# Patient Record
Sex: Male | Born: 1965 | Race: White | Hispanic: No | Marital: Single | Smoking: Never smoker
Health system: Southern US, Community
[De-identification: ages and names within clinical notes are randomized; demographics above are authoritative.]

## PROBLEM LIST (undated history)

## (undated) DIAGNOSIS — E7800 Pure hypercholesterolemia, unspecified: Secondary | ICD-10-CM

## (undated) DIAGNOSIS — N200 Calculus of kidney: Secondary | ICD-10-CM

## (undated) DIAGNOSIS — E119 Type 2 diabetes mellitus without complications: Secondary | ICD-10-CM

## (undated) DIAGNOSIS — I10 Essential (primary) hypertension: Secondary | ICD-10-CM

## (undated) DIAGNOSIS — K449 Diaphragmatic hernia without obstruction or gangrene: Secondary | ICD-10-CM

## (undated) DIAGNOSIS — IMO0002 Reserved for concepts with insufficient information to code with codable children: Secondary | ICD-10-CM

## (undated) DIAGNOSIS — Z87442 Personal history of urinary calculi: Secondary | ICD-10-CM

## (undated) DIAGNOSIS — R251 Tremor, unspecified: Secondary | ICD-10-CM

## (undated) HISTORY — PX: LITHOTRIPSY: SUR834

## (undated) HISTORY — PX: KNEE SURGERY: SHX244

## (undated) HISTORY — PX: TONSILLECTOMY: SUR1361

## (undated) HISTORY — PX: APPENDECTOMY (OPEN): SHX54

## (undated) HISTORY — PX: TONSILLECTOMY: SHX5618

## (undated) HISTORY — PX: TONSILLECTOMY AND ADENOIDECTOMY: SUR1326

## (undated) HISTORY — DX: Reserved for concepts with insufficient information to code with codable children: IMO0002

## (undated) HISTORY — PX: COLONOSCOPY: SHX174

## (undated) HISTORY — DX: Personal history of urinary calculi: Z87.442

## (undated) HISTORY — DX: Diaphragmatic hernia without obstruction or gangrene: K44.9

## (undated) HISTORY — DX: Tremor, unspecified: R25.1

---

## 2005-02-20 ENCOUNTER — Encounter: Admission: RE | Admit: 2005-02-20 | Discharge: 2005-02-20 | Payer: Self-pay | Admitting: Orthopedic Surgery

## 2005-12-02 ENCOUNTER — Emergency Department: Admit: 2005-12-02 | Payer: Self-pay | Source: Emergency Department | Admitting: Emergency Medicine

## 2005-12-02 LAB — URINALYSIS WITH MICROSCOPIC
Bilirubin, UA: NEGATIVE
Glucose, UA: NEGATIVE
Ketones UA: NEGATIVE
Leukocyte Esterase, UA: NEGATIVE
Nitrite, UA: NEGATIVE
Protein, UR: NEGATIVE
Specific Gravity UA POCT: 1.018 (ref 1.005–1.030)
Urine pH: 7 (ref 4.6–8.0)
Urobilinogen, UA: 0.2 EU/dL (ref 0.2–1.0)
WBC, UA: 2 /HPF (ref 0–5)

## 2005-12-02 LAB — BASIC METABOLIC PANEL - AH CERNER
Anion Gap: 15 mEq/L (ref 5–15)
BUN: 9 mg/dL (ref 6–20)
CO2: 20.9 mEq/L — ABNORMAL LOW (ref 22.0–29.0)
Calcium: 9.3 mg/dL (ref 8.4–10.2)
Chloride: 103 mEq/L (ref 96–108)
Creatinine: 0.9 mg/dL (ref 0.5–1.2)
Glucose: 102 mg/dL
Osmolality Calculated: 287 mosm/kg (ref 282–298)
Potassium: 3.9 mEq/L (ref 3.3–5.1)
Sodium: 139 mEq/L (ref 135–145)
UN/CREA SOFT: 10 RATIO (ref 6–33)

## 2005-12-02 LAB — CBC WITH AUTO DIFFERENTIAL CERNER
Basophils Absolute: 0 /mm3 (ref 0.0–0.2)
Basophils: 1 % (ref 0–2)
Eosinophils Absolute: 0.1 /mm3 (ref 0.0–0.2)
Eosinophils: 2 % (ref 0–5)
Granulocytes Absolute: 4.3 /mm3 (ref 1.8–8.1)
Hematocrit: 41.5 % — ABNORMAL LOW (ref 42.0–52.0)
Hgb: 14 G/DL (ref 13.0–17.0)
Lymphocytes Absolute: 2.3 /mm3 (ref 0.5–4.4)
Lymphocytes: 31 % (ref 15–41)
MCH: 29.1 PG (ref 28.0–32.0)
MCHC: 33.7 G/DL (ref 32.0–36.0)
MCV: 86.3 FL (ref 80.0–100.0)
MPV: 8.4 FL (ref 7.4–10.4)
Monocytes Absolute: 0.6 /mm3 (ref 0.0–1.2)
Monocytes: 9 % (ref 0–11)
Neutrophils %: 59 % (ref 52–75)
Platelets: 329 /mm3 (ref 140–400)
RBC: 4.81 /mm3 (ref 4.70–6.00)
RDW: 13.3 % (ref 11.5–15.0)
WBC: 7.4 /mm3 (ref 3.5–10.8)

## 2005-12-02 LAB — GFR

## 2005-12-02 LAB — HEMOLYSIS INDEX: Hemolysis Index: 7 Units

## 2005-12-07 ENCOUNTER — Emergency Department: Admit: 2005-12-07 | Payer: Self-pay | Source: Emergency Department | Admitting: Emergency Medicine

## 2005-12-07 LAB — URINALYSIS WITH MICROSCOPIC
Bilirubin, UA: NEGATIVE
Glucose, UA: NEGATIVE
Ketones UA: NEGATIVE
Nitrite, UA: NEGATIVE
Protein, UR: 100 — AB
RBC, UA: 3441 /HPF — ABNORMAL HIGH (ref 0–3)
Specific Gravity UA POCT: 1.019 (ref 1.005–1.030)
Urine pH: 5.5 (ref 4.6–8.0)
Urobilinogen, UA: 1 EU/dL (ref 0.2–1.0)
WBC, UA: 10 /HPF — ABNORMAL HIGH (ref 0–5)

## 2005-12-07 LAB — COMPREHENSIVE METABOLIC PANEL - AH CERNER
ALT: 53 U/L — ABNORMAL HIGH (ref 0–41)
AST (SGOT): 37 U/L (ref 0–37)
Albumin/Globulin Ratio: 1.9 (ref 1.1–2.2)
Albumin: 4.5 g/dL (ref 3.4–4.8)
Alkaline Phosphatase: 124 U/L (ref 40–129)
Anion Gap: 14 mEq/L (ref 5–15)
BUN: 13 mg/dL (ref 6–20)
Bilirubin, Total: 0.7 mg/dL (ref 0.0–1.0)
CA: 4.2 mEq/L (ref 3.8–4.6)
CO2: 19.8 mEq/L — ABNORMAL LOW (ref 22.0–29.0)
Calcium: 9.4 mg/dL (ref 8.4–10.2)
Chloride: 103 mEq/L (ref 96–108)
Creatinine: 0.8 mg/dL (ref 0.5–1.2)
Globulin: 2.4 g/dL (ref 2.0–3.6)
Glucose: 152 mg/dL
Osmolality Calculated: 287 mosm/kg (ref 282–298)
Potassium: 5.3 mEq/L — ABNORMAL HIGH (ref 3.3–5.1)
Protein, Total: 6.9 g/dL (ref 6.4–8.3)
Sodium: 137 mEq/L (ref 135–145)
UN/CREA SOFT: 16 RATIO (ref 6–33)

## 2005-12-07 LAB — CBC WITH AUTO DIFFERENTIAL CERNER
Basophils Absolute: 0 /mm3 (ref 0.0–0.2)
Basophils: 0 % (ref 0–2)
Eosinophils Absolute: 0.1 /mm3 (ref 0.0–0.2)
Eosinophils: 2 % (ref 0–5)
Granulocytes Absolute: 4.3 /mm3 (ref 1.8–8.1)
Hematocrit: 44.5 % (ref 42.0–52.0)
Hgb: 15.8 G/DL (ref 13.0–17.0)
Lymphocytes Absolute: 1.6 /mm3 (ref 0.5–4.4)
Lymphocytes: 25 % (ref 15–41)
MCH: 29.7 PG (ref 28.0–32.0)
MCHC: 35.4 G/DL (ref 32.0–36.0)
MCV: 83.8 FL (ref 80.0–100.0)
MPV: 7.9 FL (ref 7.4–10.4)
Monocytes Absolute: 0.4 /mm3 (ref 0.0–1.2)
Monocytes: 7 % (ref 0–11)
Neutrophils %: 66 % (ref 52–75)
Platelets: 399 /mm3 (ref 140–400)
RBC: 5.3 /mm3 (ref 4.70–6.00)
RDW: 13.4 % (ref 11.5–15.0)
WBC: 6.5 /mm3 (ref 3.5–10.8)

## 2005-12-07 LAB — HEMOLYSIS INDEX: Hemolysis Index: 232 Units

## 2005-12-07 LAB — GFR

## 2006-01-08 ENCOUNTER — Emergency Department: Admit: 2006-01-08 | Payer: Self-pay | Source: Emergency Department | Admitting: Emergency Medicine

## 2006-01-08 LAB — CBC WITH AUTO DIFFERENTIAL CERNER
Basophils Absolute: 0 /mm3 (ref 0.0–0.2)
Basophils: 1 % (ref 0–2)
Eosinophils Absolute: 0.2 /mm3 (ref 0.0–0.2)
Eosinophils: 4 % (ref 0–5)
Granulocytes Absolute: 2.5 /mm3 (ref 1.8–8.1)
Hematocrit: 46.3 % (ref 42.0–52.0)
Hgb: 16.2 G/DL (ref 13.0–17.0)
Lymphocytes Absolute: 1.2 /mm3 (ref 0.5–4.4)
Lymphocytes: 29 % (ref 15–41)
MCH: 30 PG (ref 28.0–32.0)
MCHC: 35 G/DL (ref 32.0–36.0)
MCV: 85.7 FL (ref 80.0–100.0)
MPV: 7.9 FL (ref 7.4–10.4)
Monocytes Absolute: 0.4 /mm3 (ref 0.0–1.2)
Monocytes: 9 % (ref 0–11)
Neutrophils %: 59 % (ref 52–75)
Platelets: 307 /mm3 (ref 140–400)
RBC: 5.4 /mm3 (ref 4.70–6.00)
RDW: 12.8 % (ref 11.5–15.0)
WBC: 4.3 /mm3 (ref 3.5–10.8)

## 2006-01-08 LAB — URINALYSIS WITH MICROSCOPIC
Bilirubin, UA: NEGATIVE
Blood, UA: NEGATIVE
Glucose, UA: NEGATIVE
Ketones UA: NEGATIVE
Leukocyte Esterase, UA: NEGATIVE
Nitrite, UA: NEGATIVE
Protein, UR: NEGATIVE
RBC, UA: 3 /HPF (ref 0–3)
Specific Gravity UA POCT: 1.013 (ref 1.005–1.030)
Urine pH: 8.5 — ABNORMAL HIGH (ref 4.6–8.0)
Urobilinogen, UA: 0.2 EU/dL (ref 0.2–1.0)
WBC, UA: 1 /HPF (ref 0–5)

## 2006-01-08 LAB — COMPREHENSIVE METABOLIC PANEL - AH CERNER
ALT: 54 U/L — ABNORMAL HIGH (ref 0–41)
AST (SGOT): 36 U/L (ref 0–37)
Albumin/Globulin Ratio: 2.4 — ABNORMAL HIGH (ref 1.1–2.2)
Albumin: 5 g/dL — ABNORMAL HIGH (ref 3.4–4.8)
Alkaline Phosphatase: 121 U/L (ref 40–129)
Anion Gap: 10 mEq/L (ref 5–15)
BUN: 11 mg/dL (ref 6–20)
Bilirubin, Total: 1 mg/dL (ref 0.0–1.0)
CA: 4.2 mEq/L (ref 3.8–4.6)
CO2: 28 mEq/L (ref 22.0–29.0)
Calcium: 9.6 mg/dL (ref 8.4–10.2)
Chloride: 101 mEq/L (ref 96–108)
Creatinine: 0.8 mg/dL (ref 0.5–1.2)
Globulin: 2.1 g/dL (ref 2.0–3.6)
Glucose: 127 mg/dL
Osmolality Calculated: 289 mosm/kg (ref 282–298)
Potassium: 4.4 mEq/L (ref 3.3–5.1)
Protein, Total: 7.1 g/dL (ref 6.4–8.3)
Sodium: 139 mEq/L (ref 135–145)
UN/CREA SOFT: 14 RATIO (ref 6–33)

## 2006-01-08 LAB — HEMOLYSIS INDEX: Hemolysis Index: 14 Units

## 2006-01-08 LAB — GFR

## 2006-09-21 ENCOUNTER — Emergency Department: Admit: 2006-09-21 | Payer: Self-pay | Source: Emergency Department | Admitting: Emergency Medicine

## 2006-09-21 LAB — CBC WITH AUTO DIFFERENTIAL CERNER
Basophils Absolute: 0 /mm3 (ref 0.0–0.2)
Basophils: 1 % (ref 0–2)
Eosinophils Absolute: 0.1 /mm3 (ref 0.0–0.2)
Eosinophils: 2 % (ref 0–5)
Granulocytes Absolute: 2.8 /mm3 (ref 1.8–8.1)
Hematocrit: 42.6 % (ref 42.0–52.0)
Hgb: 14.7 G/DL (ref 13.0–17.0)
Lymphocytes Absolute: 1.8 /mm3 (ref 0.5–4.4)
Lymphocytes: 35 % (ref 15–41)
MCH: 28.8 PG (ref 28.0–32.0)
MCHC: 34.5 G/DL (ref 32.0–36.0)
MCV: 83.5 FL (ref 80.0–100.0)
MPV: 8.3 FL (ref 7.4–10.4)
Monocytes Absolute: 0.4 /mm3 (ref 0.0–1.2)
Monocytes: 9 % (ref 0–11)
Neutrophils %: 54 % (ref 52–75)
Platelets: 280 /mm3 (ref 140–400)
RBC: 5.1 /mm3 (ref 4.70–6.00)
RDW: 12.6 % (ref 11.5–15.0)
WBC: 5.2 /mm3 (ref 3.5–10.8)

## 2006-09-21 LAB — GFR

## 2006-09-21 LAB — COMPREHENSIVE METABOLIC PANEL - AH CERNER
ALT: 29 U/L (ref 0–41)
AST (SGOT): 17 U/L (ref 0–37)
Albumin/Globulin Ratio: 2.6 — ABNORMAL HIGH (ref 1.1–2.2)
Albumin: 4.5 g/dL (ref 3.4–4.8)
Alkaline Phosphatase: 113 U/L (ref 40–129)
Anion Gap: 9 mEq/L (ref 5–15)
BUN: 11 mg/dL (ref 6–20)
Bilirubin, Total: 0.5 mg/dL (ref 0.0–1.0)
CA: 4.4 mEq/L (ref 3.8–4.6)
CO2: 26.8 mEq/L (ref 22.0–29.0)
Calcium: 9.2 mg/dL (ref 8.4–10.2)
Chloride: 103 mEq/L (ref 96–108)
Creatinine: 0.8 mg/dL (ref 0.5–1.2)
Globulin: 1.7 g/dL — ABNORMAL LOW (ref 2.0–3.6)
Glucose: 157 mg/dL
Osmolality Calculated: 291 mosm/kg (ref 282–298)
Potassium: 4.4 mEq/L (ref 3.3–5.1)
Protein, Total: 6.2 g/dL — ABNORMAL LOW (ref 6.4–8.3)
Sodium: 139 mEq/L (ref 133–145)
UN/CREA SOFT: 14 RATIO (ref 6–33)

## 2006-09-21 LAB — URINALYSIS WITH MICROSCOPIC
Bilirubin, UA: NEGATIVE
Ketones UA: NEGATIVE
Leukocyte Esterase, UA: NEGATIVE
Nitrite, UA: NEGATIVE
Protein, UR: NEGATIVE
RBC, UA: 36 /HPF — ABNORMAL HIGH (ref 0–3)
Specific Gravity UA POCT: 1.026 (ref 1.005–1.030)
Urine pH: 5 (ref 4.6–8.0)
Urobilinogen, UA: 0.2 EU/dL (ref 0.2–1.0)

## 2006-09-21 LAB — HEMOLYSIS INDEX: Hemolysis Index: 40 Units

## 2006-10-04 ENCOUNTER — Emergency Department: Admit: 2006-10-04 | Payer: Self-pay | Source: Emergency Department | Admitting: Emergency Medicine

## 2006-10-04 LAB — URINALYSIS WITH MICROSCOPIC
Bilirubin, UA: NEGATIVE
Glucose, UA: NEGATIVE
Ketones UA: NEGATIVE
Leukocyte Esterase, UA: NEGATIVE
Nitrite, UA: NEGATIVE
Protein, UR: NEGATIVE
RBC, UA: 26 /HPF — ABNORMAL HIGH (ref 0–3)
Specific Gravity UA POCT: 1.026 (ref 1.005–1.030)
Urine pH: 5 (ref 4.6–8.0)
Urobilinogen, UA: 0.2 EU/dL (ref 0.2–1.0)

## 2006-10-04 LAB — COMPREHENSIVE METABOLIC PANEL - AH CERNER
ALT: 47 U/L — ABNORMAL HIGH (ref 0–41)
AST (SGOT): 25 U/L (ref 0–37)
Albumin/Globulin Ratio: 2.1 (ref 1.1–2.2)
Albumin: 4.8 g/dL (ref 3.4–4.8)
Alkaline Phosphatase: 134 U/L — ABNORMAL HIGH (ref 40–129)
Anion Gap: 12 mEq/L (ref 5–15)
BUN: 13 mg/dL (ref 6–20)
Bilirubin, Total: 0.5 mg/dL (ref 0.0–1.0)
CA: 4.7 mEq/L — ABNORMAL HIGH (ref 3.8–4.6)
CO2: 26.6 mEq/L (ref 22.0–29.0)
Calcium: 10.6 mg/dL — ABNORMAL HIGH (ref 8.4–10.2)
Chloride: 101 mEq/L (ref 96–108)
Creatinine: 0.8 mg/dL (ref 0.5–1.2)
Globulin: 2.3 g/dL (ref 2.0–3.6)
Glucose: 165 mg/dL
Osmolality Calculated: 294 mosm/kg (ref 282–298)
Potassium: 4.3 mEq/L (ref 3.3–5.1)
Protein, Total: 7.1 g/dL (ref 6.4–8.3)
Sodium: 140 mEq/L (ref 133–145)
UN/CREA SOFT: 16 RATIO (ref 6–33)

## 2006-10-04 LAB — CBC WITH AUTO DIFFERENTIAL CERNER
Basophils Absolute: 0 /mm3 (ref 0.0–0.2)
Basophils: 0 % (ref 0–2)
Eosinophils Absolute: 0.2 /mm3 (ref 0.0–0.2)
Eosinophils: 3 % (ref 0–5)
Granulocytes Absolute: 3.9 /mm3 (ref 1.8–8.1)
Hematocrit: 44.5 % (ref 42.0–52.0)
Hgb: 15.8 G/DL (ref 13.0–17.0)
Lymphocytes Absolute: 2.3 /mm3 (ref 0.5–4.4)
Lymphocytes: 32 % (ref 15–41)
MCH: 29.6 PG (ref 28.0–32.0)
MCHC: 35.5 G/DL (ref 32.0–36.0)
MCV: 83.4 FL (ref 80.0–100.0)
MPV: 8.4 FL (ref 7.4–10.4)
Monocytes Absolute: 0.6 /mm3 (ref 0.0–1.2)
Monocytes: 9 % (ref 0–11)
Neutrophils %: 56 % (ref 52–75)
Platelets: 316 /mm3 (ref 140–400)
RBC: 5.33 /mm3 (ref 4.70–6.00)
RDW: 12.4 % (ref 11.5–15.0)
WBC: 6.9 /mm3 (ref 3.5–10.8)

## 2006-10-04 LAB — HEMOLYSIS INDEX: Hemolysis Index: 13 Units

## 2006-10-04 LAB — GFR

## 2006-10-16 ENCOUNTER — Emergency Department: Admit: 2006-10-16 | Payer: Self-pay | Source: Emergency Department | Admitting: Emergency Medicine

## 2006-10-16 LAB — COMPREHENSIVE METABOLIC PANEL - AH CERNER
ALT: 40 U/L (ref 0–41)
AST (SGOT): 23 U/L (ref 0–37)
Albumin/Globulin Ratio: 2.4 — ABNORMAL HIGH (ref 1.1–2.2)
Albumin: 4.6 g/dL (ref 3.4–4.8)
Alkaline Phosphatase: 115 U/L (ref 40–129)
Anion Gap: 13 mEq/L (ref 5–15)
BUN: 12 mg/dL (ref 6–20)
Bilirubin, Total: 0.4 mg/dL (ref 0.0–1.0)
CA: 4.3 mEq/L (ref 3.8–4.6)
CO2: 24.6 mEq/L (ref 22.0–29.0)
Calcium: 9.4 mg/dL (ref 8.4–10.2)
Chloride: 100 mEq/L (ref 96–108)
Creatinine: 0.9 mg/dL (ref 0.5–1.2)
Globulin: 1.9 g/dL — ABNORMAL LOW (ref 2.0–3.6)
Glucose: 207 mg/dL
Osmolality Calculated: 292 mosm/kg (ref 282–298)
Potassium: 4.1 mEq/L (ref 3.3–5.1)
Protein, Total: 6.5 g/dL (ref 6.4–8.3)
Sodium: 138 mEq/L (ref 133–145)
UN/CREA SOFT: 13 RATIO (ref 6–33)

## 2006-10-16 LAB — CBC WITH AUTO DIFFERENTIAL CERNER
Basophils Absolute: 0 /mm3 (ref 0.0–0.2)
Basophils: 1 % (ref 0–2)
Eosinophils Absolute: 0.1 /mm3 (ref 0.0–0.2)
Eosinophils: 2 % (ref 0–5)
Granulocytes Absolute: 3.5 /mm3 (ref 1.8–8.1)
Hematocrit: 41.9 % — ABNORMAL LOW (ref 42.0–52.0)
Hgb: 14.5 G/DL (ref 13.0–17.0)
Lymphocytes Absolute: 2.1 /mm3 (ref 0.5–4.4)
Lymphocytes: 34 % (ref 15–41)
MCH: 29 PG (ref 28.0–32.0)
MCHC: 34.5 G/DL (ref 32.0–36.0)
MCV: 84.1 FL (ref 80.0–100.0)
MPV: 8.4 FL (ref 7.4–10.4)
Monocytes Absolute: 0.5 /mm3 (ref 0.0–1.2)
Monocytes: 8 % (ref 0–11)
Neutrophils %: 56 % (ref 52–75)
Platelets: 303 /mm3 (ref 140–400)
RBC: 4.98 /mm3 (ref 4.70–6.00)
RDW: 12.7 % (ref 11.5–15.0)
WBC: 6.2 /mm3 (ref 3.5–10.8)

## 2006-10-16 LAB — URINALYSIS WITH MICROSCOPIC
Bilirubin, UA: NEGATIVE
Ketones UA: NEGATIVE
Leukocyte Esterase, UA: NEGATIVE
Nitrite, UA: NEGATIVE
Protein, UR: 30
RBC, UA: 1717 /HPF — ABNORMAL HIGH (ref 0–3)
Specific Gravity UA POCT: 1.025 (ref 1.005–1.030)
Urine pH: 5 (ref 4.6–8.0)
Urobilinogen, UA: 0.2 EU/dL (ref 0.2–1.0)

## 2006-10-16 LAB — HEMOLYSIS INDEX: Hemolysis Index: 50 Units

## 2006-10-16 LAB — GFR

## 2007-07-18 ENCOUNTER — Emergency Department: Admit: 2007-07-18 | Payer: Self-pay | Source: Emergency Department | Admitting: Emergency Medicine

## 2007-07-18 LAB — CBC AND DIFFERENTIAL
Basophils Absolute: 0 /mm3 (ref 0.0–0.2)
Basophils: 0 % (ref 0–2)
Eosinophils Absolute: 0.1 /mm3 (ref 0.0–0.7)
Eosinophils: 2 % (ref 0–5)
Granulocytes Absolute: 3.3 /mm3 (ref 1.8–8.1)
Hematocrit: 42.9 % (ref 42.0–52.0)
Hgb: 15.6 G/DL (ref 13.0–17.0)
Immature Granulocytes Absolute: 0 CUMM (ref 0.0–0.0)
Immature Granulocytes: 0 % (ref 0–1)
Lymphocytes Absolute: 1.7 /mm3 (ref 0.5–4.4)
Lymphocytes: 30 % (ref 15–41)
MCH: 29.9 PG (ref 28.0–32.0)
MCHC: 36.4 G/DL — ABNORMAL HIGH (ref 32.0–36.0)
MCV: 82.2 FL (ref 80.0–100.0)
MPV: 10.6 FL (ref 9.4–12.3)
Monocytes Absolute: 0.4 /mm3 (ref 0.0–1.2)
Monocytes: 7 % (ref 0–11)
Neutrophils %: 61 % (ref 52–75)
Platelets: 290 /mm3 (ref 140–400)
RBC: 5.22 /mm3 (ref 4.70–6.00)
RDW: 12.2 % (ref 11.5–15.0)
WBC: 5.5 /mm3 (ref 3.50–10.80)

## 2007-07-18 LAB — COMPREHENSIVE METABOLIC PANEL - AH CERNER
ALT: 24 U/L (ref 0–41)
AST (SGOT): 15 U/L (ref 0–37)
Albumin/Globulin Ratio: 2.5 — ABNORMAL HIGH (ref 1.1–2.2)
Albumin: 4.7 g/dL (ref 3.4–4.8)
Alkaline Phosphatase: 137 U/L — ABNORMAL HIGH (ref 40–129)
Anion Gap: 14 mEq/L (ref 5–15)
BUN: 11 mg/dL (ref 6–20)
Bilirubin, Total: 0.5 mg/dL (ref 0.0–1.0)
CA: 4.6 mEq/L (ref 3.8–4.6)
CO2: 21.9 mEq/L — ABNORMAL LOW (ref 22.0–29.0)
Calcium: 10 mg/dL (ref 8.4–10.2)
Chloride: 98 mEq/L (ref 96–108)
Creatinine: 0.9 mg/dL (ref 0.5–1.2)
Globulin: 1.9 g/dL — ABNORMAL LOW (ref 2.0–3.6)
Glucose: 455 mg/dL — ABNORMAL HIGH (ref 70–100)
Osmolality Calculated: 297 mosm/kg (ref 282–298)
Potassium: 4.4 mEq/L (ref 3.3–5.1)
Protein, Total: 6.6 g/dL (ref 6.4–8.3)
Sodium: 134 mEq/L (ref 133–145)
UN/CREA SOFT: 12 RATIO (ref 6–33)

## 2007-07-18 LAB — URINALYSIS WITH MICROSCOPIC
Bilirubin, UA: NEGATIVE
Blood, UA: NEGATIVE
Glucose, UA: >1000
Ketones UA: NEGATIVE
Leukocyte Esterase, UA: NEGATIVE
Nitrite, UA: NEGATIVE
Protein, UR: NEGATIVE
Specific Gravity UA POCT: 1.041 — ABNORMAL HIGH (ref 1.005–1.030)
Urine pH: 5.5 (ref 4.6–8.0)
Urobilinogen, UA: 0.2 EU/dL (ref 0.2–1.0)

## 2007-07-18 LAB — HEMOLYSIS INDEX: Hemolysis Index: 14 Units

## 2007-07-18 LAB — GFR

## 2007-09-21 ENCOUNTER — Emergency Department: Admit: 2007-09-21 | Payer: Self-pay | Source: Emergency Department | Admitting: Emergency Medicine

## 2007-09-21 LAB — DRUG SCREEN,URINE RANDOM CERNER

## 2007-09-21 LAB — URINALYSIS WITH MICROSCOPIC
Bilirubin, UA: NEGATIVE
Glucose, UA: NEGATIVE
Ketones UA: NEGATIVE
Leukocyte Esterase, UA: NEGATIVE
Nitrite, UA: NEGATIVE
Protein, UR: NEGATIVE
RBC, UA: 13 /HPF — ABNORMAL HIGH (ref 0–3)
Specific Gravity UA POCT: 1.025 (ref 1.005–1.030)
Urine pH: 5 (ref 4.6–8.0)
Urobilinogen, UA: 0.2 EU/dL (ref 0.2–1.0)
WBC, UA: 1 /HPF (ref 0–5)

## 2007-09-21 LAB — GFR

## 2007-09-21 LAB — COMPREHENSIVE METABOLIC PANEL - AH CERNER
ALT: 30 U/L (ref 0–41)
AST (SGOT): 25 U/L (ref 0–37)
Albumin/Globulin Ratio: 2.4 — ABNORMAL HIGH (ref 1.1–2.2)
Albumin: 5.1 g/dL — ABNORMAL HIGH (ref 3.4–4.8)
Alkaline Phosphatase: 127 U/L (ref 40–129)
Anion Gap: 13 mEq/L (ref 5–15)
BUN: 12 mg/dL (ref 6–20)
Bilirubin, Total: 1.1 mg/dL — ABNORMAL HIGH (ref 0.0–1.0)
CA: 4.4 mEq/L (ref 3.8–4.6)
CO2: 26.2 mEq/L (ref 22.0–29.0)
Calcium: 10 mg/dL (ref 8.4–10.2)
Chloride: 100 mEq/L (ref 96–108)
Creatinine: 0.8 mg/dL (ref 0.5–1.2)
Globulin: 2.1 g/dL (ref 2.0–3.6)
Glucose: 158 mg/dL — ABNORMAL HIGH (ref 70–100)
Osmolality Calculated: 291 mosm/kg (ref 282–298)
Potassium: 4.8 mEq/L (ref 3.3–5.1)
Protein, Total: 7.2 g/dL (ref 6.4–8.3)
Sodium: 139 mEq/L (ref 133–145)
UN/CREA SOFT: 15 RATIO (ref 6–33)

## 2007-09-21 LAB — CBC AND DIFFERENTIAL
Basophils Absolute: 0 /mm3 (ref 0.0–0.2)
Basophils: 0 % (ref 0–2)
Eosinophils Absolute: 0 /mm3 (ref 0.0–0.7)
Eosinophils: 1 % (ref 0–5)
Granulocytes Absolute: 3.8 /mm3 (ref 1.8–8.1)
Hematocrit: 45.2 % (ref 42.0–52.0)
Hgb: 16 G/DL (ref 13.0–17.0)
Immature Granulocytes Absolute: 0 CUMM (ref 0.0–0.0)
Immature Granulocytes: 0 % (ref 0–1)
Lymphocytes Absolute: 1.3 /mm3 (ref 0.5–4.4)
Lymphocytes: 23 % (ref 15–41)
MCH: 29.5 PG (ref 28.0–32.0)
MCHC: 35.4 G/DL (ref 32.0–36.0)
MCV: 83.4 FL (ref 80.0–100.0)
MPV: 10.7 FL (ref 9.4–12.3)
Monocytes Absolute: 0.4 /mm3 (ref 0.0–1.2)
Monocytes: 7 % (ref 0–11)
Neutrophils %: 69 % (ref 52–75)
Platelets: 295 /mm3 (ref 140–400)
RBC: 5.42 /mm3 (ref 4.70–6.00)
RDW: 12.3 % (ref 11.5–15.0)
WBC: 5.56 /mm3 (ref 3.50–10.80)

## 2007-09-21 LAB — URIC ACID: Uric acid: 4.2 mg/dL (ref 3.4–7.0)

## 2007-09-21 LAB — HEMOLYSIS INDEX: Hemolysis Index: 113 Units

## 2007-12-06 ENCOUNTER — Emergency Department: Admit: 2007-12-06 | Payer: Self-pay | Source: Emergency Department | Admitting: Emergency Medicine

## 2007-12-06 LAB — CBC AND DIFFERENTIAL
Basophils Absolute: 0 /mm3 (ref 0.0–0.2)
Basophils: 0 % (ref 0–2)
Eosinophils Absolute: 0.1 /mm3 (ref 0.0–0.7)
Eosinophils: 1 % (ref 0–5)
Granulocytes Absolute: 3.9 /mm3 (ref 1.8–8.1)
Hematocrit: 43.2 % (ref 42.0–52.0)
Hgb: 15.7 G/DL (ref 13.0–17.0)
Immature Granulocytes Absolute: 0 CUMM (ref 0.0–0.0)
Immature Granulocytes: 0 % (ref 0–1)
Lymphocytes Absolute: 2.4 /mm3 (ref 0.5–4.4)
Lymphocytes: 34 % (ref 15–41)
MCH: 29.7 PG (ref 28.0–32.0)
MCHC: 36.3 G/DL — ABNORMAL HIGH (ref 32.0–36.0)
MCV: 81.7 FL (ref 80.0–100.0)
MPV: 10.9 FL (ref 9.4–12.3)
Monocytes Absolute: 0.5 /mm3 (ref 0.0–1.2)
Monocytes: 8 % (ref 0–11)
Neutrophils %: 56 % (ref 52–75)
Platelets: 368 /mm3 (ref 140–400)
RBC: 5.29 /mm3 (ref 4.70–6.00)
RDW: 12.4 % (ref 11.5–15.0)
WBC: 6.92 /mm3 (ref 3.50–10.80)

## 2007-12-06 LAB — URINALYSIS WITH MICROSCOPIC
Bilirubin, UA: NEGATIVE
Glucose, UA: 50
Ketones UA: 25
Leukocyte Esterase, UA: NEGATIVE
Nitrite, UA: NEGATIVE
Protein, UR: 100 — AB
RBC, UA: 715 /HPF — ABNORMAL HIGH (ref 0–3)
Specific Gravity UA POCT: 1.03 (ref 1.005–1.030)
Urine pH: 6 (ref 4.6–8.0)
Urobilinogen, UA: NORMAL EU/dL
WBC, UA: 8 /HPF — ABNORMAL HIGH (ref 0–5)

## 2007-12-06 LAB — GFR

## 2007-12-06 LAB — COMPREHENSIVE METABOLIC PANEL - AH CERNER
ALT: 32 U/L (ref 0–41)
AST (SGOT): 17 U/L (ref 0–37)
Albumin/Globulin Ratio: 2.4 — ABNORMAL HIGH (ref 1.1–2.2)
Albumin: 5.1 g/dL — ABNORMAL HIGH (ref 3.4–4.8)
Alkaline Phosphatase: 111 U/L (ref 40–129)
Anion Gap: 17 mEq/L — ABNORMAL HIGH (ref 5–15)
BUN: 14 mg/dL (ref 6–20)
Bilirubin, Total: 0.5 mg/dL (ref 0.0–1.0)
CA: 4.4 mEq/L (ref 3.8–4.6)
CO2: 20.4 mEq/L — ABNORMAL LOW (ref 22.0–29.0)
Calcium: 10 mg/dL (ref 8.4–10.2)
Chloride: 99 mEq/L (ref 96–108)
Creatinine: 0.7 mg/dL (ref 0.5–1.2)
Globulin: 2.1 g/dL (ref 2.0–3.6)
Glucose: 224 mg/dL — ABNORMAL HIGH (ref 70–100)
Osmolality Calculated: 289 mosm/kg (ref 282–298)
Potassium: 4.2 mEq/L (ref 3.3–5.1)
Protein, Total: 7.2 g/dL (ref 6.4–8.3)
Sodium: 136 mEq/L (ref 133–145)
UN/CREA SOFT: 20 RATIO (ref 6–33)

## 2007-12-06 LAB — PT AND APTT
PT INR: 0.9 {INR} (ref 0.9–1.1)
PT: 10.7 s — ABNORMAL LOW (ref 10.8–13.3)
PTT: 22 s (ref 21–32)

## 2007-12-06 LAB — HEMOLYSIS INDEX: Hemolysis Index: 8 Units

## 2008-05-22 ENCOUNTER — Emergency Department: Admit: 2008-05-22 | Payer: Self-pay | Source: Emergency Department | Admitting: Emergency Medicine

## 2008-05-22 LAB — COMPREHENSIVE METABOLIC PANEL
ALT: 26 U/L (ref 0–55)
AST (SGOT): 16 U/L (ref 5–34)
Albumin/Globulin Ratio: 1.6 (ref 0.9–2.2)
Albumin: 4.2 g/dL (ref 3.5–5.0)
Alkaline Phosphatase: 116 U/L (ref 40–150)
BUN: 10 mg/dL (ref 9–21)
Bilirubin, Total: 0.6 mg/dL (ref 0.2–1.2)
CO2: 21 mEq/L — ABNORMAL LOW (ref 22–29)
Calcium: 9.7 mg/dL (ref 8.9–10.0)
Chloride: 101 mEq/L (ref 96–107)
Creatinine: 0.8 mg/dL (ref 0.7–1.3)
Globulin: 2.7 g/dL (ref 2.0–3.6)
Glucose: 115 mg/dL — ABNORMAL HIGH (ref 70–110)
Potassium: 4.1 mEq/L (ref 3.5–5.1)
Protein, Total: 6.9 g/dL (ref 6.0–8.3)
Sodium: 136 mEq/L (ref 136–145)

## 2008-05-22 LAB — URINALYSIS WITH MICROSCOPIC
Bilirubin, UA: NEGATIVE
Blood, UA: NEGATIVE
Glucose, UA: NEGATIVE
Ketones UA: 100
Leukocyte Esterase, UA: NEGATIVE
Nitrite, UA: NEGATIVE
Protein, UR: NEGATIVE
RBC, UA: 2 /HPF (ref 0–3)
Specific Gravity UA POCT: 1.01 (ref 1.005–1.030)
Urine pH: 8 (ref 4.6–8.0)
Urobilinogen, UA: NORMAL mg/dL
WBC, UA: <=1 /HPF (ref 0–?)

## 2008-05-22 LAB — CBC AND DIFFERENTIAL
Basophils Absolute: 0 /mm3 (ref 0.0–0.2)
Basophils: 0 % (ref 0–2)
Eosinophils Absolute: 0.1 /mm3 (ref 0.0–0.7)
Eosinophils: 1 % (ref 0–5)
Granulocytes Absolute: 4.2 /mm3 (ref 1.8–8.1)
Hematocrit: 42.7 % (ref 42.0–52.0)
Hgb: 15.4 G/DL (ref 13.0–17.0)
Immature Granulocytes Absolute: 0 CUMM (ref 0.0–0.0)
Immature Granulocytes: 0 % (ref 0–1)
Lymphocytes Absolute: 2.5 /mm3 (ref 0.5–4.4)
Lymphocytes: 34 % (ref 15–41)
MCH: 29.1 PG (ref 28.0–32.0)
MCHC: 36.1 G/DL — ABNORMAL HIGH (ref 32.0–36.0)
MCV: 80.6 FL (ref 80.0–100.0)
MPV: 10.7 FL (ref 9.4–12.3)
Monocytes Absolute: 0.6 /mm3 (ref 0.0–1.2)
Monocytes: 9 % (ref 0–11)
Neutrophils %: 56 % (ref 52–75)
Platelets: 295 /mm3 (ref 140–400)
RBC: 5.3 /mm3 (ref 4.70–6.00)
RDW: 12.6 % (ref 11.5–15.0)
WBC: 7.41 /mm3 (ref 3.50–10.80)

## 2008-05-22 LAB — GFR

## 2008-05-22 LAB — HEMOLYSIS INDEX: Hemolysis Index: 12 Units

## 2008-06-03 ENCOUNTER — Emergency Department: Admit: 2008-06-03 | Payer: Self-pay | Source: Emergency Department | Admitting: Emergency Medicine

## 2008-06-03 LAB — CBC AND DIFFERENTIAL
Basophils Absolute: 0 /mm3 (ref 0.0–0.2)
Basophils: 0 % (ref 0–2)
Eosinophils Absolute: 0.1 /mm3 (ref 0.0–0.7)
Eosinophils: 2 % (ref 0–5)
Granulocytes Absolute: 3.4 /mm3 (ref 1.8–8.1)
Hematocrit: 40.7 % — ABNORMAL LOW (ref 42.0–52.0)
Hgb: 14.7 G/DL (ref 13.0–17.0)
Immature Granulocytes Absolute: 0 CUMM (ref 0.0–0.0)
Immature Granulocytes: 0 % (ref 0–1)
Lymphocytes Absolute: 1.7 /mm3 (ref 0.5–4.4)
Lymphocytes: 29 % (ref 15–41)
MCH: 29.1 PG (ref 28.0–32.0)
MCHC: 36.1 G/DL — ABNORMAL HIGH (ref 32.0–36.0)
MCV: 80.6 FL (ref 80.0–100.0)
MPV: 10.7 FL (ref 9.4–12.3)
Monocytes Absolute: 0.5 /mm3 (ref 0.0–1.2)
Monocytes: 9 % (ref 0–11)
Neutrophils %: 60 % (ref 52–75)
Platelets: 309 /mm3 (ref 140–400)
RBC: 5.05 /mm3 (ref 4.70–6.00)
RDW: 12.5 % (ref 11.5–15.0)
WBC: 5.76 /mm3 (ref 3.50–10.80)

## 2008-06-03 LAB — URINALYSIS WITH MICROSCOPIC
Bilirubin, UA: NEGATIVE
Glucose, UA: 50
Ketones UA: NEGATIVE
Leukocyte Esterase, UA: NEGATIVE
Nitrite, UA: NEGATIVE
Protein, UR: 100 — AB
RBC, UA: 3667 /HPF — ABNORMAL HIGH (ref 0–3)
Specific Gravity UA POCT: 1.03 (ref 1.005–1.030)
Urine pH: 6 (ref 4.6–8.0)
Urobilinogen, UA: 2 mg/dL — ABNORMAL HIGH (ref 0.2–1.0)
WBC, UA: 1 /HPF (ref 0–5)

## 2008-06-03 LAB — HEMOLYSIS INDEX: Hemolysis Index: 16 Units

## 2008-06-03 LAB — BASIC METABOLIC PANEL
BUN: 11 mg/dL (ref 9–21)
CO2: 22 mEq/L (ref 22–29)
Calcium: 9.5 mg/dL (ref 8.9–10.0)
Chloride: 104 mEq/L (ref 96–107)
Creatinine: 0.8 mg/dL (ref 0.7–1.3)
Glucose: 175 mg/dL — ABNORMAL HIGH (ref 70–110)
Potassium: 4.3 mEq/L (ref 3.5–5.1)
Sodium: 138 mEq/L (ref 136–145)

## 2008-06-03 LAB — GFR

## 2008-07-01 ENCOUNTER — Emergency Department: Admit: 2008-07-01 | Payer: Self-pay | Source: Emergency Department | Admitting: Emergency Medicine

## 2008-07-01 LAB — COMPREHENSIVE METABOLIC PANEL
ALT: 48 U/L (ref 0–55)
AST (SGOT): 28 U/L (ref 5–34)
Albumin/Globulin Ratio: 1.4 (ref 0.9–2.2)
Albumin: 4.2 g/dL (ref 3.5–5.0)
Alkaline Phosphatase: 117 U/L (ref 40–150)
BUN: 11 mg/dL (ref 9–21)
Bilirubin, Total: 0.7 mg/dL (ref 0.2–1.2)
CO2: 18 mEq/L — ABNORMAL LOW (ref 22–29)
Calcium: 9.9 mg/dL (ref 8.9–10.0)
Chloride: 103 mEq/L (ref 96–107)
Creatinine: 0.8 mg/dL (ref 0.7–1.3)
Globulin: 3 g/dL (ref 2.0–3.6)
Glucose: 150 mg/dL — ABNORMAL HIGH (ref 70–110)
Potassium: 4.7 mEq/L (ref 3.5–5.1)
Protein, Total: 7.2 g/dL (ref 6.0–8.3)
Sodium: 138 mEq/L (ref 136–145)

## 2008-07-01 LAB — URINALYSIS WITH MICROSCOPIC
Bilirubin, UA: NEGATIVE
Glucose, UA: NEGATIVE
Ketones UA: NEGATIVE
Leukocyte Esterase, UA: NEGATIVE
Nitrite, UA: NEGATIVE
Protein, UR: 30
RBC, UA: 24 /HPF — ABNORMAL HIGH (ref 0–3)
Specific Gravity UA POCT: 1.03 (ref 1.005–1.030)
Urine pH: 5 (ref 4.6–8.0)
Urobilinogen, UA: 2 mg/dL — ABNORMAL HIGH (ref 0.2–1.0)
WBC, UA: <=1 /HPF (ref 0–?)

## 2008-07-01 LAB — CBC AND DIFFERENTIAL
Basophils Absolute: 0.1 /mm3 (ref 0.0–0.2)
Basophils: 1 % (ref 0–2)
Eosinophils Absolute: 0.1 /mm3 (ref 0.0–0.7)
Eosinophils: 2 % (ref 0–5)
Granulocytes Absolute: 3.4 /mm3 (ref 1.8–8.1)
Hematocrit: 41.9 % — ABNORMAL LOW (ref 42.0–52.0)
Hgb: 15.4 G/DL (ref 13.0–17.0)
Immature Granulocytes Absolute: 0 CUMM (ref 0.0–0.0)
Immature Granulocytes: 0 % (ref 0–1)
Lymphocytes Absolute: 2.5 /mm3 (ref 0.5–4.4)
Lymphocytes: 37 % (ref 15–41)
MCH: 29.1 PG (ref 28.0–32.0)
MCHC: 36.8 G/DL — ABNORMAL HIGH (ref 32.0–36.0)
MCV: 79.1 FL — ABNORMAL LOW (ref 80.0–100.0)
MPV: 10.2 FL (ref 9.4–12.3)
Monocytes Absolute: 0.7 /mm3 (ref 0.0–1.2)
Monocytes: 10 % (ref 0–11)
Neutrophils %: 50 % — ABNORMAL LOW (ref 52–75)
Platelets: 295 /mm3 (ref 140–400)
RBC: 5.3 /mm3 (ref 4.70–6.00)
RDW: 12.5 % (ref 11.5–15.0)
WBC: 6.78 /mm3 (ref 3.50–10.80)

## 2008-07-01 LAB — GFR

## 2008-07-01 LAB — HEMOLYSIS INDEX: Hemolysis Index: 166 Units

## 2008-09-03 ENCOUNTER — Emergency Department: Admit: 2008-09-03 | Payer: Self-pay | Source: Emergency Department | Admitting: Emergency Medicine

## 2008-09-04 LAB — URINALYSIS WITH MICROSCOPIC
Bilirubin, UA: NEGATIVE
Blood, UA: NEGATIVE
Glucose, UA: 500
Ketones UA: NEGATIVE
Leukocyte Esterase, UA: NEGATIVE
Nitrite, UA: NEGATIVE
Protein, UR: NEGATIVE
RBC, UA: 1 /HPF (ref 0–3)
Specific Gravity UA POCT: 1.025 (ref 1.005–1.030)
Urine pH: 5 (ref 4.6–8.0)
Urobilinogen, UA: 2 mg/dL — ABNORMAL HIGH (ref 0.2–1.0)
WBC, UA: 2 /HPF (ref 0–5)

## 2008-12-18 ENCOUNTER — Emergency Department: Admit: 2008-12-18 | Payer: Self-pay | Source: Emergency Department | Admitting: Internal Medicine

## 2008-12-18 LAB — URINALYSIS WITH MICROSCOPIC
Bilirubin, UA: NEGATIVE
Blood, UA: NEGATIVE
Glucose, UA: 500 — AB
Ketones UA: NEGATIVE
Leukocyte Esterase, UA: NEGATIVE
Nitrite, UA: NEGATIVE
Protein, UR: NEGATIVE
RBC, UA: 4 /HPF (ref 0–5)
Specific Gravity UA POCT: 1.025 (ref 1.001–1.035)
Urine pH: 6 (ref 5.0–8.0)
Urobilinogen, UA: NORMAL mg/dL
WBC, UA: 2 /HPF (ref 0–5)

## 2008-12-18 LAB — COMPREHENSIVE METABOLIC PANEL
ALT: 34 U/L (ref 0–55)
AST (SGOT): 17 U/L (ref 5–34)
Albumin/Globulin Ratio: 1.7 (ref 0.9–2.2)
Albumin: 4.2 g/dL (ref 3.5–5.0)
Alkaline Phosphatase: 119 U/L (ref 40–150)
BUN: 14 mg/dL (ref 9.0–21.0)
Bilirubin, Total: 0.4 mg/dL (ref 0.2–1.2)
CO2: 27 mEq/L (ref 22–29)
Calcium: 10.2 mg/dL (ref 8.5–10.5)
Chloride: 100 mEq/L (ref 98–107)
Creatinine: 0.8 mg/dL (ref 0.7–1.3)
Globulin: 2.5 g/dL (ref 2.0–3.6)
Glucose: 239 mg/dL — ABNORMAL HIGH (ref 70–100)
Potassium: 4.3 mEq/L (ref 3.5–5.1)
Protein, Total: 6.7 g/dL (ref 6.0–8.3)
Sodium: 138 mEq/L (ref 136–145)

## 2008-12-18 LAB — CBC AND DIFFERENTIAL
Basophils %: 0 % (ref 0–2)
Basophils Absolute: 0 10*3/uL (ref 0.00–0.20)
Eosinophils %: 1 % (ref 0–5)
Eosinophils Absolute: 0.1 10*3/uL (ref 0.00–0.70)
Granulocytes #: 3.8 10*3/uL (ref 1.80–8.10)
Hematocrit: 42 % (ref 42.0–52.0)
Hgb: 14.9 g/dL (ref 13.0–17.0)
Immature Granulocytes Absolute: 0.03 10*3/uL — ABNORMAL HIGH
Immature Granulocytes: 1 % (ref 0–1)
LYMPH#: 1.8 10*3/uL (ref 0.50–4.40)
Lymphocytes: 29 % (ref 15–41)
MCH: 29.3 pg (ref 28.0–32.0)
MCHC: 35.5 g/dL (ref 32.0–36.0)
MCV: 82.7 fL (ref 80.0–100.0)
MPV: 10.7 fL (ref 9.4–12.3)
Monocytes Absolute: 0.5 10*3/uL (ref 0.00–1.20)
Monocytes: 9 % (ref 0–11)
Neutrophils %: 61 % (ref 52–75)
Platelets: 300 10*3/uL (ref 140–400)
RBC: 5.08 10*6/uL (ref 4.70–6.00)
RDW: 13 % (ref 12–15)
WBC: 6.27 10*3/uL (ref 3.50–10.80)

## 2008-12-18 LAB — GFR: EGFR: >60

## 2008-12-18 LAB — HEMOLYSIS INDEX: Hemolysis Index: 5

## 2009-01-13 ENCOUNTER — Emergency Department: Admit: 2009-01-13 | Payer: Self-pay | Source: Emergency Department | Admitting: Emergency Medicine

## 2009-01-13 LAB — CBC AND DIFFERENTIAL
Basophils %: 0 % (ref 0–2)
Basophils Absolute: 0 10*3/uL (ref 0.00–0.20)
Eosinophils %: 2 % (ref 0–5)
Eosinophils Absolute: 0.1 10*3/uL (ref 0.00–0.70)
Granulocytes Absolute: 3.6 10*3/uL (ref 1.80–8.10)
Hematocrit: 44.4 % (ref 42.0–52.0)
Hgb: 15.7 g/dL (ref 13.0–17.0)
Immature Granulocytes Absolute: 0.02 10*3/uL — ABNORMAL HIGH
Immature Granulocytes: 0 % (ref 0–1)
Lymphocytes Absolute: 2.3 10*3/uL (ref 0.50–4.40)
Lymphocytes: 35 % (ref 15–41)
MCH: 28.8 pg (ref 28.0–32.0)
MCHC: 35.4 g/dL (ref 32.0–36.0)
MCV: 81.5 fL (ref 80.0–100.0)
MPV: 10.6 fL (ref 9.4–12.3)
Monocytes Absolute: 0.6 10*3/uL (ref 0.00–1.20)
Monocytes: 8 % (ref 0–11)
Neutrophils %: 55 % (ref 52–75)
Platelets: 318 10*3/uL (ref 140–400)
RBC: 5.45 10*6/uL (ref 4.70–6.00)
RDW: 12 % (ref 12–15)
WBC: 6.63 10*3/uL (ref 3.50–10.80)

## 2009-01-13 LAB — URINALYSIS WITH MICROSCOPIC
Bilirubin, UA: NEGATIVE
Glucose, UA: NEGATIVE
Ketones UA: NEGATIVE
Leukocyte Esterase, UA: NEGATIVE
Nitrite, UA: NEGATIVE
Protein, UR: 100 — AB
RBC, UA: >182 /HPF (ref 0–5)
Specific Gravity UA POCT: 1.025 (ref 1.001–1.035)
Squamous Epithelial Cells, Urine: 2 /HPF (ref 0–25)
Urine pH: 6 (ref 5.0–8.0)
Urobilinogen, UA: NORMAL mg/dL

## 2009-01-13 LAB — HEMOLYSIS INDEX: Hemolysis Index: 9

## 2009-01-13 LAB — BASIC METABOLIC PANEL
BUN: 10 mg/dL (ref 9.0–21.0)
CO2: 23 mEq/L (ref 22–29)
Calcium: 9.5 mg/dL (ref 8.5–10.5)
Chloride: 104 mEq/L (ref 98–107)
Creatinine: 0.8 mg/dL (ref 0.7–1.3)
Glucose: 213 mg/dL — ABNORMAL HIGH (ref 70–100)
Potassium: 4.2 mEq/L (ref 3.5–5.1)
Sodium: 139 mEq/L (ref 136–145)

## 2009-01-13 LAB — GFR: EGFR: >60

## 2009-05-22 ENCOUNTER — Emergency Department: Admit: 2009-05-22 | Payer: Self-pay | Source: Emergency Department | Admitting: Emergency Medicine

## 2009-05-22 LAB — COMPREHENSIVE METABOLIC PANEL
ALT: 24 U/L (ref 0–55)
AST (SGOT): 25 U/L (ref 5–34)
Albumin/Globulin Ratio: 1.3 (ref 0.9–2.2)
Albumin: 4.3 g/dL (ref 3.5–5.0)
Alkaline Phosphatase: 129 U/L (ref 40–150)
Anion Gap: 14 (ref 5.0–15.0)
BUN: 11 mg/dL (ref 9.0–21.0)
Bilirubin, Total: 0.7 mg/dL (ref 0.2–1.2)
CO2: 21 mEq/L — ABNORMAL LOW (ref 22–29)
Calcium: 10.7 mg/dL — ABNORMAL HIGH (ref 8.5–10.5)
Chloride: 102 mEq/L (ref 98–107)
Creatinine: 0.8 mg/dL (ref 0.7–1.3)
Globulin: 3.3 g/dL (ref 2.0–3.6)
Glucose: 261 mg/dL — ABNORMAL HIGH (ref 70–100)
Potassium: 5.4 mEq/L — ABNORMAL HIGH (ref 3.5–5.1)
Protein, Total: 7.6 g/dL (ref 6.0–8.3)
Sodium: 137 mEq/L (ref 136–145)

## 2009-05-22 LAB — CBC AND DIFFERENTIAL
Baso(Absolute): 0.02 10*3/uL (ref 0.00–0.20)
Basophils: 0 % (ref 0–2)
Eosinophils Absolute: 0.08 10*3/uL (ref 0.00–0.70)
Eosinophils: 1 % (ref 0–5)
Hematocrit: 44.5 % (ref 42.0–52.0)
Hgb: 16.5 g/dL (ref 13.0–17.0)
Immature Granulocytes Absolute: 0.01 10*3/uL
Immature Granulocytes: 0 % (ref 0–1)
Lymphocytes Absolute: 2.34 10*3/uL (ref 0.50–4.40)
Lymphocytes: 37 % (ref 15–41)
MCH: 29.8 pg (ref 28.0–32.0)
MCHC: 37.1 g/dL — ABNORMAL HIGH (ref 32.0–36.0)
MCV: 80.5 fL (ref 80.0–100.0)
MPV: 11.1 fL (ref 9.4–12.3)
Monocytes Absolute: 0.44 10*3/uL (ref 0.00–1.20)
Monocytes: 7 % (ref 0–11)
Neutrophils Absolute: 3.52 10*3/uL
Neutrophils: 55 % (ref 52–75)
Platelets: 340 10*3/uL (ref 140–400)
RBC: 5.53 10*6/uL (ref 4.70–6.00)
RDW: 12 % (ref 12–15)
WBC: 6.4 10*3/uL (ref 3.50–10.80)

## 2009-05-22 LAB — GFR: EGFR: >60

## 2009-05-22 LAB — LIPASE: Lipase: 34 U/L (ref 8–78)

## 2009-05-22 LAB — HEMOLYSIS INDEX: Hemolysis Index: 176 Index — ABNORMAL HIGH (ref 0–18)

## 2009-09-05 ENCOUNTER — Encounter: Admission: RE | Admit: 2009-09-05 | Discharge: 2009-09-05 | Payer: Self-pay | Admitting: Cardiology

## 2009-09-05 ENCOUNTER — Ambulatory Visit: Payer: Self-pay | Admitting: Cardiology

## 2009-12-08 ENCOUNTER — Emergency Department: Admit: 2009-12-08 | Payer: Self-pay | Source: Emergency Department | Admitting: Emergency Medicine

## 2009-12-09 LAB — GFR: EGFR: >60

## 2009-12-09 LAB — CBC AND DIFFERENTIAL
Baso(Absolute): 0.05 10*3/uL (ref 0.00–0.20)
Basophils: 1 % (ref 0–2)
Eosinophils Absolute: 0.12 10*3/uL (ref 0.00–0.70)
Eosinophils: 2 % (ref 0–5)
Hematocrit: 39.2 % — ABNORMAL LOW (ref 42.0–52.0)
Hgb: 14.5 g/dL (ref 13.0–17.0)
Immature Granulocytes Absolute: 0.03 10*3/uL
Immature Granulocytes: 1 % (ref 0–1)
Lymphocytes Absolute: 1.97 10*3/uL (ref 0.50–4.40)
Lymphocytes: 32 % (ref 15–41)
MCH: 29.8 pg (ref 28.0–32.0)
MCHC: 37 g/dL — ABNORMAL HIGH (ref 32.0–36.0)
MCV: 80.5 fL (ref 80.0–100.0)
MPV: 10.6 fL (ref 9.4–12.3)
Monocytes Absolute: 0.54 10*3/uL (ref 0.00–1.20)
Monocytes: 9 % (ref 0–11)
Neutrophils Absolute: 3.57 10*3/uL
Neutrophils: 57 % (ref 52–75)
Platelets: 272 10*3/uL (ref 140–400)
RBC: 4.87 10*6/uL (ref 4.70–6.00)
RDW: 12 % (ref 12–15)
WBC: 6.25 10*3/uL (ref 3.50–10.80)

## 2009-12-09 LAB — COMPREHENSIVE METABOLIC PANEL
ALT: 28 U/L (ref 0–55)
AST (SGOT): 15 U/L (ref 5–34)
Albumin/Globulin Ratio: 1.5 (ref 0.9–2.2)
Albumin: 4 g/dL (ref 3.5–5.0)
Alkaline Phosphatase: 113 U/L (ref 40–150)
Anion Gap: 12 (ref 5.0–15.0)
BUN: 12 mg/dL (ref 9.0–21.0)
Bilirubin, Total: 0.8 mg/dL (ref 0.2–1.2)
CO2: 23 mEq/L (ref 22–29)
Calcium: 9.5 mg/dL (ref 8.5–10.5)
Chloride: 102 mEq/L (ref 98–107)
Creatinine: 0.9 mg/dL (ref 0.7–1.3)
Globulin: 2.6 g/dL (ref 2.0–3.6)
Glucose: 331 mg/dL — ABNORMAL HIGH (ref 70–100)
Potassium: 4.3 mEq/L (ref 3.5–5.1)
Protein, Total: 6.6 g/dL (ref 6.0–8.3)
Sodium: 137 mEq/L (ref 136–145)

## 2009-12-09 LAB — HEMOLYSIS INDEX: Hemolysis Index: 21 Index — ABNORMAL HIGH (ref 0–18)

## 2010-01-09 ENCOUNTER — Emergency Department: Admit: 2010-01-09 | Payer: Self-pay | Source: Emergency Department | Admitting: Emergency Medicine

## 2010-01-09 LAB — URINALYSIS, REFLEX TO MICROSCOPIC EXAM IF INDICATED
Bilirubin, UA: NEGATIVE
Blood, UA: NEGATIVE
Glucose, UA: 500 — AB
Hyaline Casts, UA: 1 /LPF (ref 0–5)
Ketones UA: NEGATIVE
Leukocyte Esterase, UA: NEGATIVE
Nitrite, UA: NEGATIVE
Protein, UR: 30 — AB
RBC, UA: 1 /HPF (ref 0–5)
Specific Gravity UA POCT: 1.025 (ref 1.001–1.035)
Urine pH: 5 (ref 5.0–8.0)
Urobilinogen, UA: NORMAL mg/dL
WBC, UA: 1 /HPF (ref 0–5)

## 2010-01-09 LAB — COMPREHENSIVE METABOLIC PANEL
ALT: 33 U/L (ref 0–55)
AST (SGOT): 21 U/L (ref 5–34)
Albumin/Globulin Ratio: 1.8 (ref 0.9–2.2)
Albumin: 4.6 g/dL (ref 3.5–5.0)
Alkaline Phosphatase: 128 U/L (ref 40–150)
Anion Gap: 10 (ref 5.0–15.0)
BUN: 9 mg/dL (ref 9.0–21.0)
Bilirubin, Total: 0.6 mg/dL (ref 0.2–1.2)
CO2: 25 mEq/L (ref 22–29)
Calcium: 10 mg/dL (ref 8.5–10.5)
Chloride: 102 mEq/L (ref 98–107)
Creatinine: 0.9 mg/dL (ref 0.7–1.3)
Globulin: 2.5 g/dL (ref 2.0–3.6)
Glucose: 266 mg/dL — ABNORMAL HIGH (ref 70–100)
Potassium: 4.5 mEq/L (ref 3.5–5.1)
Protein, Total: 7.1 g/dL (ref 6.0–8.3)
Sodium: 137 mEq/L (ref 136–145)

## 2010-01-09 LAB — HEMOLYSIS INDEX: Hemolysis Index: 20 Index — ABNORMAL HIGH (ref 0–18)

## 2010-01-09 LAB — GFR: EGFR: >60

## 2010-02-23 ENCOUNTER — Emergency Department (HOSPITAL_COMMUNITY)
Admission: EM | Admit: 2010-02-23 | Discharge: 2010-02-23 | Disposition: A | Discharge status: Home / Self Care | Payer: PRIVATE HEALTH INSURANCE | Attending: Emergency Medicine | Admitting: Emergency Medicine

## 2010-02-23 ENCOUNTER — Emergency Department (HOSPITAL_COMMUNITY): Payer: PRIVATE HEALTH INSURANCE

## 2010-02-23 DIAGNOSIS — N200 Calculus of kidney: Secondary | ICD-10-CM | POA: Insufficient documentation

## 2010-02-23 DIAGNOSIS — R109 Unspecified abdominal pain: Secondary | ICD-10-CM | POA: Insufficient documentation

## 2010-02-23 DIAGNOSIS — E119 Type 2 diabetes mellitus without complications: Secondary | ICD-10-CM | POA: Insufficient documentation

## 2010-02-23 LAB — URINALYSIS, ROUTINE W REFLEX MICROSCOPIC
Ketones, ur: 15 mg/dL — AB
Nitrite: NEGATIVE
Protein, ur: NEGATIVE mg/dL
Urobilinogen, UA: 0.2 mg/dL (ref 0.0–1.0)

## 2010-02-23 LAB — URINE MICROSCOPIC-ADD ON

## 2010-03-05 ENCOUNTER — Emergency Department: Admit: 2010-03-05 | Payer: Self-pay | Source: Emergency Department | Admitting: Emergency Medicine

## 2010-03-05 LAB — CBC AND DIFFERENTIAL
Basophils Absolute Automated: 0.04 10*3/uL (ref 0.00–0.20)
Basophils Automated: 1 % (ref 0–2)
Eosinophils Absolute Automated: 0.11 10*3/uL (ref 0.00–0.70)
Eosinophils Automated: 2 % (ref 0–5)
Hematocrit: 44 % (ref 42.0–52.0)
Hgb: 15.6 g/dL (ref 13.0–17.0)
Immature Granulocytes Absolute: 0.02 10*3/uL
Immature Granulocytes: 0 % (ref 0–1)
Lymphocytes Absolute Automated: 2.91 10*3/uL (ref 0.50–4.40)
Lymphocytes Automated: 42 % — ABNORMAL HIGH (ref 15–41)
MCH: 29.3 pg (ref 28.0–32.0)
MCHC: 35.5 g/dL (ref 32.0–36.0)
MCV: 82.6 fL (ref 80.0–100.0)
MPV: 10.4 fL (ref 9.4–12.3)
Monocytes Absolute Automated: 0.6 10*3/uL (ref 0.00–1.20)
Monocytes: 9 % (ref 0–11)
Neutrophils Absolute: 3.24 10*3/uL (ref 1.80–8.10)
Neutrophils: 47 % — ABNORMAL LOW (ref 52–75)
Nucleated RBC: 0 /100 WBC
Platelets: 279 10*3/uL (ref 140–400)
RBC: 5.33 10*6/uL (ref 4.70–6.00)
RDW: 12 % (ref 12–15)
WBC: 6.92 10*3/uL (ref 3.50–10.80)

## 2010-03-05 LAB — BASIC METABOLIC PANEL
Anion Gap: 11 (ref 5.0–15.0)
BUN: 12 mg/dL (ref 9.0–21.0)
CO2: 24 mEq/L (ref 22–29)
Calcium: 9.9 mg/dL (ref 8.5–10.5)
Chloride: 99 mEq/L (ref 98–107)
Creatinine: 0.8 mg/dL (ref 0.7–1.3)
Glucose: 285 mg/dL — ABNORMAL HIGH (ref 70–100)
Potassium: 4 mEq/L (ref 3.5–5.1)
Sodium: 134 mEq/L — ABNORMAL LOW (ref 136–145)

## 2010-03-05 LAB — HEMOLYSIS INDEX: Hemolysis Index: 16 Index (ref 0–18)

## 2010-03-05 LAB — GFR: EGFR: >60

## 2010-06-11 ENCOUNTER — Emergency Department
Admit: 2010-06-11 | Disposition: A | Discharge status: Home / Self Care | Payer: Self-pay | Source: Emergency Department | Admitting: Pediatric Emergency Medicine

## 2010-06-11 LAB — URINALYSIS, REFLEX TO MICROSCOPIC EXAM IF INDICATED
Bilirubin, UA: NEGATIVE
Glucose, UA: 150 — AB
Ketones UA: NEGATIVE
Leukocyte Esterase, UA: NEGATIVE
Nitrite, UA: NEGATIVE
Protein, UR: 30 — AB
RBC, UA: >182 /HPF (ref 0–5)
Specific Gravity UA POCT: 1.025 (ref 1.001–1.035)
Urine pH: 6 (ref 5.0–8.0)
Urobilinogen, UA: NORMAL mg/dL
WBC, UA: 2 /HPF (ref 0–5)

## 2010-06-11 LAB — COMPREHENSIVE METABOLIC PANEL
ALT: 18 U/L (ref 0–55)
AST (SGOT): 15 U/L (ref 5–34)
Albumin/Globulin Ratio: 1.8 (ref 0.9–2.2)
Albumin: 4.6 g/dL (ref 3.5–5.0)
Alkaline Phosphatase: 118 U/L (ref 40–150)
Anion Gap: 7 (ref 5.0–15.0)
BUN: 13 mg/dL (ref 9.0–21.0)
Bilirubin, Total: 1.2 mg/dL (ref 0.2–1.2)
CO2: 27 mEq/L (ref 22–29)
Calcium: 10.1 mg/dL (ref 8.5–10.5)
Chloride: 104 mEq/L (ref 98–107)
Creatinine: 0.8 mg/dL (ref 0.7–1.3)
Globulin: 2.6 g/dL (ref 2.0–3.6)
Glucose: 197 mg/dL — ABNORMAL HIGH (ref 70–100)
Potassium: 4.4 mEq/L (ref 3.5–5.1)
Protein, Total: 7.2 g/dL (ref 6.0–8.3)
Sodium: 138 mEq/L (ref 136–145)

## 2010-06-11 LAB — CBC AND DIFFERENTIAL
Basophils Absolute Automated: 0.03 10*3/uL (ref 0.00–0.20)
Basophils Automated: 0 % (ref 0–2)
Eosinophils Absolute Automated: 0.06 10*3/uL (ref 0.00–0.70)
Eosinophils Automated: 1 % (ref 0–5)
Hematocrit: 44 % (ref 42.0–52.0)
Hgb: 15.7 g/dL (ref 13.0–17.0)
Immature Granulocytes Absolute: 0.03 10*3/uL
Immature Granulocytes: 1 % (ref 0–1)
Lymphocytes Absolute Automated: 1.9 10*3/uL (ref 0.50–4.40)
Lymphocytes Automated: 31 % (ref 15–41)
MCH: 29.6 pg (ref 28.0–32.0)
MCHC: 35.7 g/dL (ref 32.0–36.0)
MCV: 83 fL (ref 80.0–100.0)
MPV: 10.4 fL (ref 9.4–12.3)
Monocytes Absolute Automated: 0.49 10*3/uL (ref 0.00–1.20)
Monocytes: 8 % (ref 0–11)
Neutrophils Absolute: 3.64 10*3/uL (ref 1.80–8.10)
Neutrophils: 60 % (ref 52–75)
Nucleated RBC: 0 /100 WBC
Platelets: 322 10*3/uL (ref 140–400)
RBC: 5.3 10*6/uL (ref 4.70–6.00)
RDW: 12 % (ref 12–15)
WBC: 6.12 10*3/uL (ref 3.50–10.80)

## 2010-06-11 LAB — HEMOLYSIS INDEX: Hemolysis Index: 30 Index — ABNORMAL HIGH (ref 0–18)

## 2010-06-11 LAB — GFR: EGFR: >60

## 2010-10-16 ENCOUNTER — Emergency Department
Admit: 2010-10-16 | Disposition: A | Discharge status: Home / Self Care | Payer: Self-pay | Source: Emergency Department | Admitting: Emergency Medicine

## 2010-10-16 LAB — CBC AND DIFFERENTIAL
Basophils Absolute Automated: 0.03 10*3/uL (ref 0.00–0.20)
Basophils Automated: 0 % (ref 0–2)
Eosinophils Absolute Automated: 0.06 10*3/uL (ref 0.00–0.70)
Eosinophils Automated: 1 % (ref 0–5)
Hematocrit: 42.6 % (ref 42.0–52.0)
Hgb: 15.4 g/dL (ref 13.0–17.0)
Immature Granulocytes Absolute: 0.02 10*3/uL
Immature Granulocytes: 0 % (ref 0–1)
Lymphocytes Absolute Automated: 1.87 10*3/uL (ref 0.50–4.40)
Lymphocytes Automated: 34 % (ref 15–41)
MCH: 29.8 pg (ref 28.0–32.0)
MCHC: 36.2 g/dL — ABNORMAL HIGH (ref 32.0–36.0)
MCV: 82.4 fL (ref 80.0–100.0)
MPV: 10.6 fL (ref 9.4–12.3)
Monocytes Absolute Automated: 0.34 10*3/uL (ref 0.00–1.20)
Monocytes: 6 % (ref 0–11)
Neutrophils Absolute: 3.29 10*3/uL (ref 1.80–8.10)
Neutrophils: 59 % (ref 52–75)
Nucleated RBC: 0 /100 WBC
Platelets: 285 10*3/uL (ref 140–400)
RBC: 5.17 10*6/uL (ref 4.70–6.00)
RDW: 13 % (ref 12–15)
WBC: 5.59 10*3/uL (ref 3.50–10.80)

## 2010-10-16 LAB — URINALYSIS, REFLEX TO MICROSCOPIC EXAM IF INDICATED
Bilirubin, UA: NEGATIVE
Glucose, UA: 500 — AB
Leukocyte Esterase, UA: NEGATIVE
Nitrite, UA: NEGATIVE
Protein, UR: 100 — AB
Specific Gravity UA POCT: 1.036 (ref 1.001–1.035)
Urine pH: 5 (ref 5.0–8.0)
Urobilinogen, UA: NEGATIVE mg/dL

## 2010-10-16 LAB — COMPREHENSIVE METABOLIC PANEL
ALT: 17 U/L (ref 0–55)
AST (SGOT): 13 U/L (ref 5–34)
Albumin/Globulin Ratio: 1.6 (ref 0.9–2.2)
Albumin: 4.4 g/dL (ref 3.5–5.0)
Alkaline Phosphatase: 115 U/L (ref 40–150)
Anion Gap: 14 (ref 5.0–15.0)
BUN: 12 mg/dL (ref 9.0–21.0)
Bilirubin, Total: 0.5 mg/dL (ref 0.2–1.2)
CO2: 26 mEq/L (ref 22–29)
Calcium: 9.6 mg/dL (ref 8.5–10.5)
Chloride: 99 mEq/L (ref 98–107)
Creatinine: 1.1 mg/dL (ref 0.7–1.3)
Globulin: 2.7 g/dL (ref 2.0–3.6)
Glucose: 220 mg/dL — ABNORMAL HIGH (ref 70–100)
Potassium: 3.9 mEq/L (ref 3.5–5.1)
Protein, Total: 7.1 g/dL (ref 6.0–8.3)
Sodium: 139 mEq/L (ref 136–145)

## 2010-10-16 LAB — HEMOLYSIS INDEX: Hemolysis Index: 12 Index (ref 0–18)

## 2010-10-16 LAB — GFR: EGFR: >60

## 2010-10-28 LAB — ECG 12-LEAD
Atrial Rate: 71 {beats}/min
P Axis: 33 degrees
P-R Interval: 168 ms
Q-T Interval: 378 ms
QRS Duration: 84 ms
QTC Calculation (Bezet): 410 ms
R Axis: 23 degrees
T Axis: 15 degrees
Ventricular Rate: 71 {beats}/min

## 2011-01-04 ENCOUNTER — Emergency Department
Admit: 2011-01-04 | Discharge: 2011-01-04 | Disposition: A | Discharge status: Home / Self Care | Payer: Self-pay | Source: Emergency Department | Admitting: Internal Medicine

## 2011-01-04 LAB — COMPREHENSIVE METABOLIC PANEL
ALT: 24 U/L (ref 0–55)
AST (SGOT): 14 U/L (ref 5–34)
Albumin/Globulin Ratio: 1.9 (ref 0.9–2.2)
Albumin: 4.5 g/dL (ref 3.5–5.0)
Alkaline Phosphatase: 132 U/L (ref 40–150)
Anion Gap: 11 (ref 5.0–15.0)
BUN: 8 mg/dL — ABNORMAL LOW (ref 9.0–21.0)
Bilirubin, Total: 0.8 mg/dL (ref 0.2–1.2)
CO2: 24 mEq/L (ref 22–29)
Calcium: 9.8 mg/dL (ref 8.5–10.5)
Chloride: 101 mEq/L (ref 98–107)
Creatinine: 0.9 mg/dL (ref 0.7–1.3)
Globulin: 2.4 g/dL (ref 2.0–3.6)
Glucose: 314 mg/dL — ABNORMAL HIGH (ref 70–100)
Potassium: 4.3 mEq/L (ref 3.5–5.1)
Protein, Total: 6.9 g/dL (ref 6.0–8.3)
Sodium: 136 mEq/L (ref 136–145)

## 2011-01-04 LAB — CBC AND DIFFERENTIAL
Basophils Absolute Automated: 0.02 10*3/uL (ref 0.00–0.20)
Basophils Automated: 0 % (ref 0–2)
Eosinophils Absolute Automated: 0.05 10*3/uL (ref 0.00–0.70)
Eosinophils Automated: 1 % (ref 0–5)
Hematocrit: 42 % (ref 42.0–52.0)
Hgb: 15.6 g/dL (ref 13.0–17.0)
Immature Granulocytes Absolute: 0.01 10*3/uL
Immature Granulocytes: 0 % (ref 0–1)
Lymphocytes Absolute Automated: 1.68 10*3/uL (ref 0.50–4.40)
Lymphocytes Automated: 31 % (ref 15–41)
MCH: 29.9 pg (ref 28.0–32.0)
MCHC: 37.1 g/dL — ABNORMAL HIGH (ref 32.0–36.0)
MCV: 80.6 fL (ref 80.0–100.0)
MPV: 10.4 fL (ref 9.4–12.3)
Monocytes Absolute Automated: 0.43 10*3/uL (ref 0.00–1.20)
Monocytes: 8 % (ref 0–11)
Neutrophils Absolute: 3.29 10*3/uL (ref 1.80–8.10)
Neutrophils: 60 % (ref 52–75)
Nucleated RBC: 0 /100 WBC
Platelets: 300 10*3/uL (ref 140–400)
RBC: 5.21 10*6/uL (ref 4.70–6.00)
RDW: 12 % (ref 12–15)
WBC: 5.47 10*3/uL (ref 3.50–10.80)

## 2011-01-04 LAB — URINALYSIS, REFLEX TO MICROSCOPIC EXAM IF INDICATED
Bilirubin, UA: NEGATIVE
Blood, UA: NEGATIVE
Glucose, UA: 500 — AB
Ketones UA: NEGATIVE
Leukocyte Esterase, UA: NEGATIVE
Nitrite, UA: NEGATIVE
Protein, UR: NEGATIVE
Specific Gravity UA POCT: 1.042 (ref 1.001–1.035)
Urine pH: 6 (ref 5.0–8.0)
Urobilinogen, UA: NEGATIVE mg/dL

## 2011-01-04 LAB — HEMOLYSIS INDEX: Hemolysis Index: 9 Index (ref 0–18)

## 2011-01-04 LAB — GFR: EGFR: >60

## 2011-01-30 IMAGING — CR DG CHEST 2V
2 series · 2 of 2 positions shown · non-contrast
Comparison: None.

CLINICAL DATA: Chest pain

CHEST - 2 VIEW

[w chest pa]
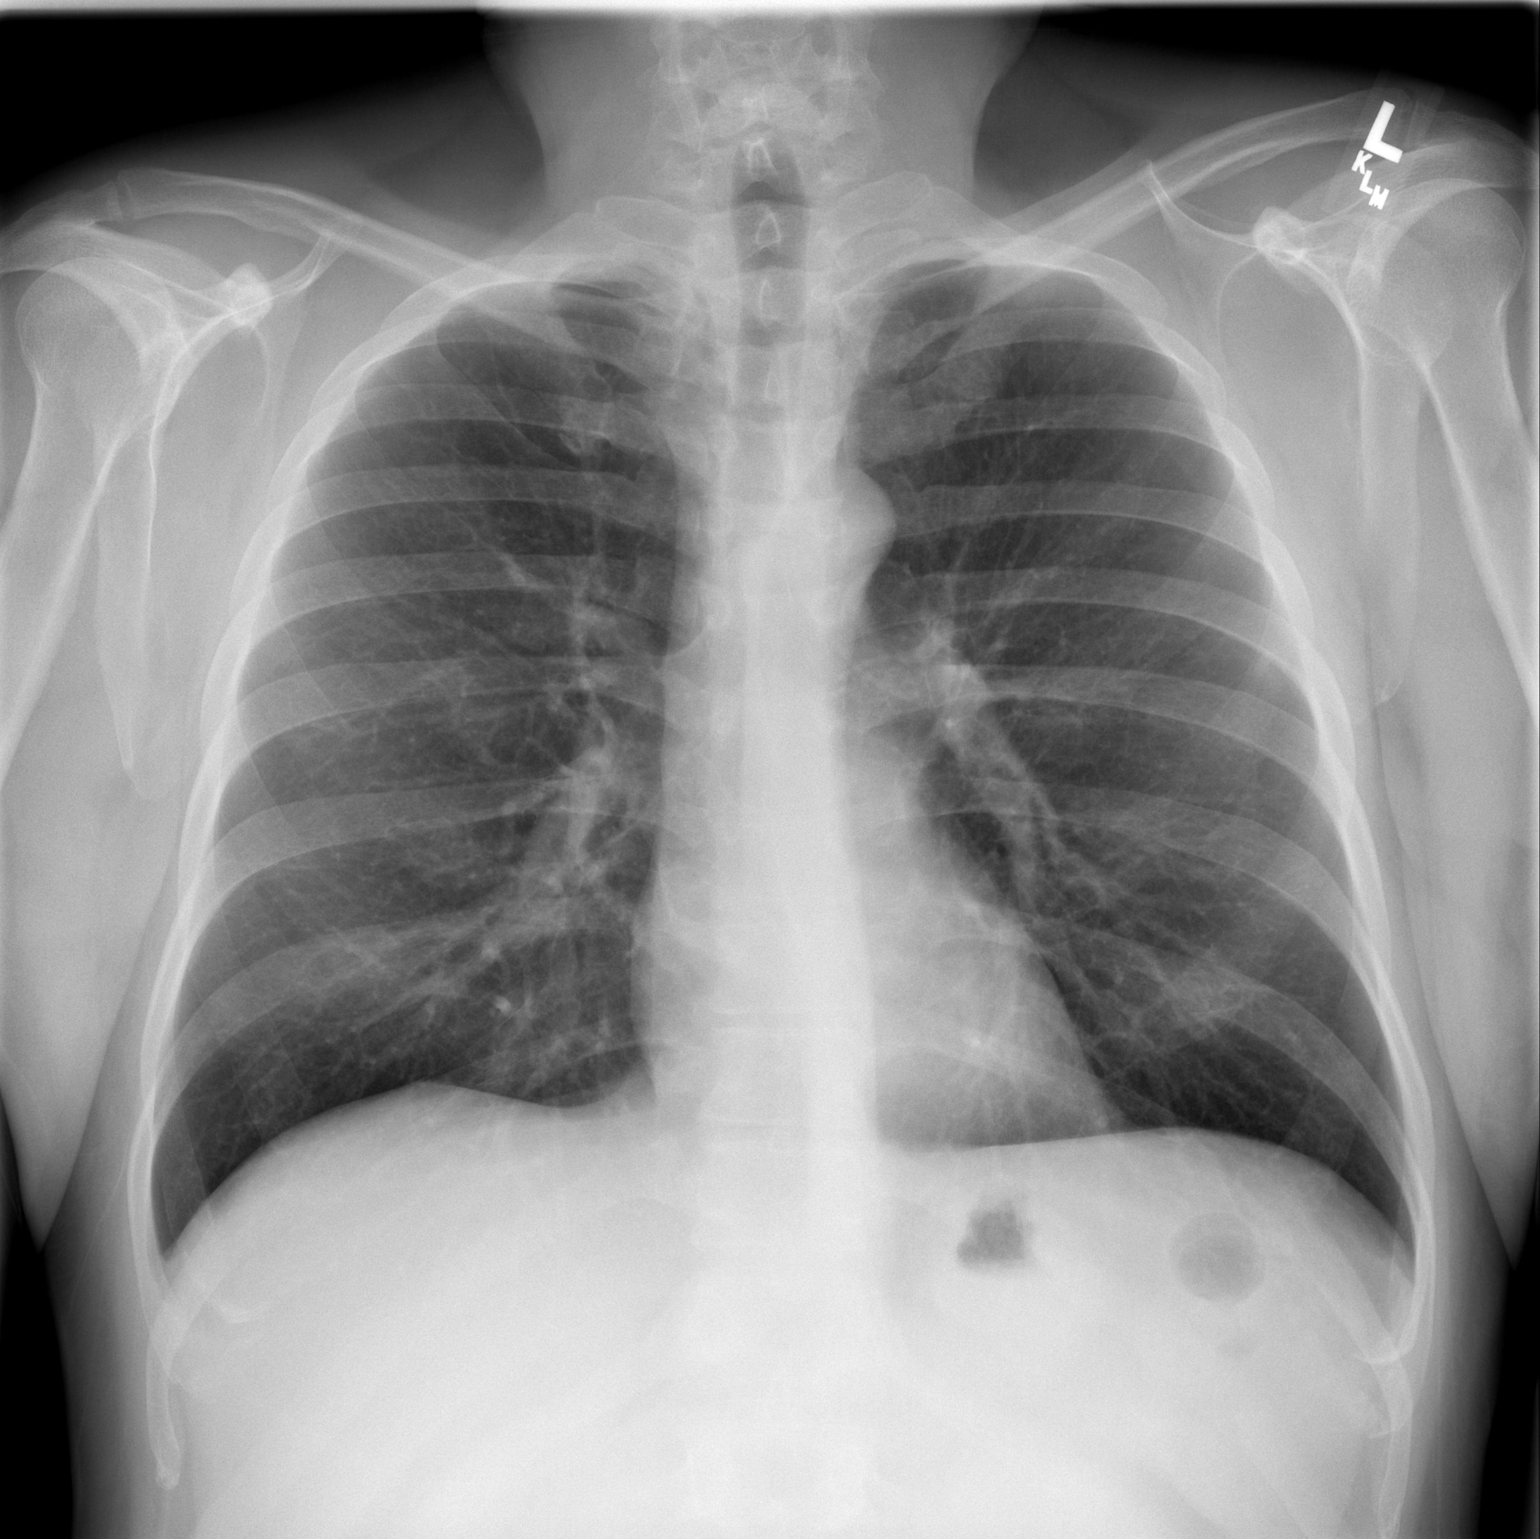

[w chest lat]
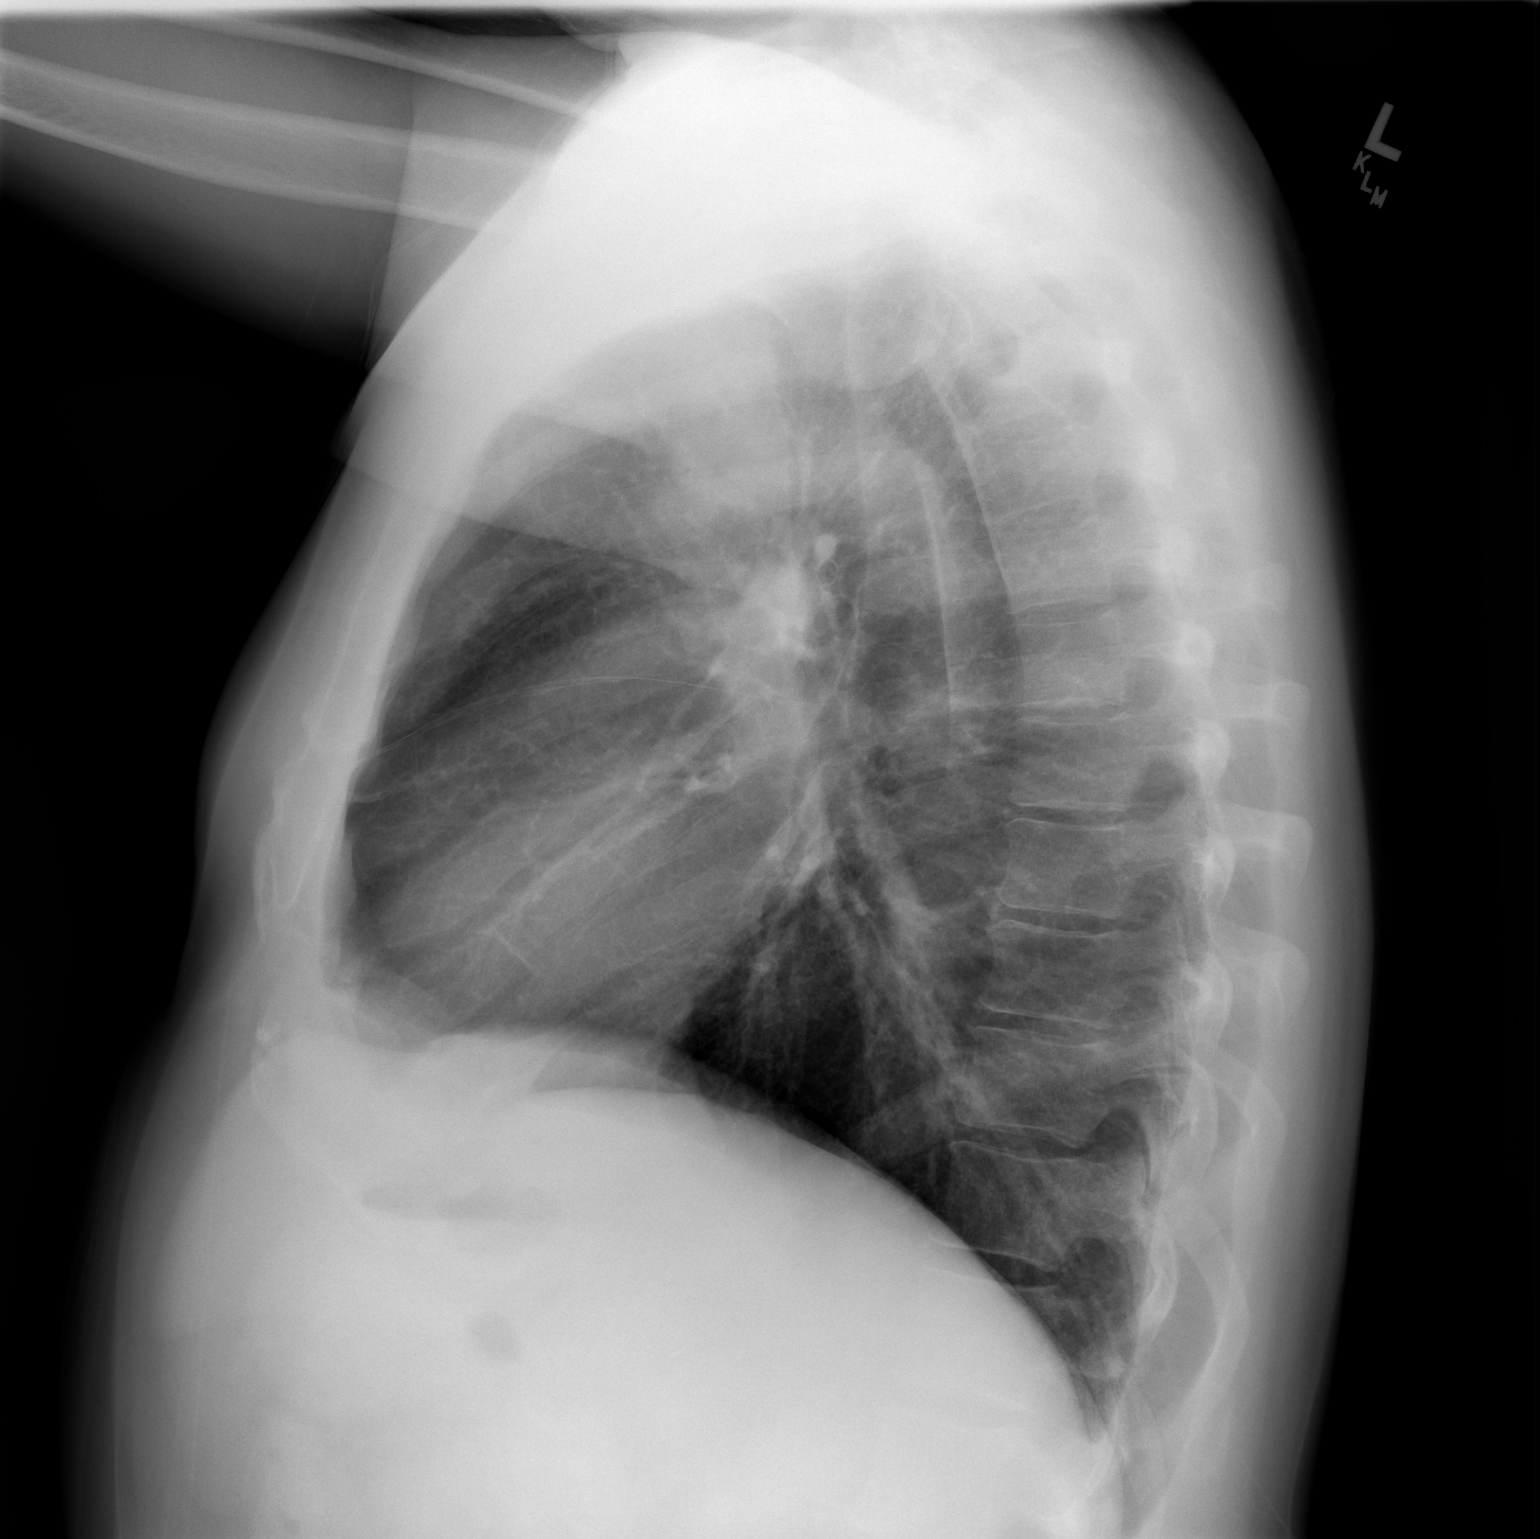

[2 of 2 positions shown; findings below may reference images not displayed]

FINDINGS: The lungs are clear bilaterally.  No confluent airspace
opacities, pleural effuions or pneumothoracies are seen.  The
heart is normal in size in contour.  The upper abdomen and osseous
structures are normal.
IMPRESSION: No acute cardiopulmonary disease.

## 2011-07-02 ENCOUNTER — Encounter: Payer: Self-pay | Admitting: *Deleted

## 2011-08-08 ENCOUNTER — Emergency Department (HOSPITAL_COMMUNITY)
Admission: EM | Admit: 2011-08-08 | Discharge: 2011-08-09 | Disposition: A | Discharge status: Home / Self Care | Payer: PRIVATE HEALTH INSURANCE | Attending: Emergency Medicine | Admitting: Emergency Medicine

## 2011-08-08 DIAGNOSIS — N390 Urinary tract infection, site not specified: Secondary | ICD-10-CM | POA: Insufficient documentation

## 2011-08-08 DIAGNOSIS — N23 Unspecified renal colic: Secondary | ICD-10-CM | POA: Insufficient documentation

## 2011-08-09 LAB — URINALYSIS, ROUTINE W REFLEX MICROSCOPIC
Bilirubin Urine: NEGATIVE
Glucose, UA: >1000 mg/dL — AB
Ketones, ur: 15 mg/dL — AB
Nitrite: NEGATIVE
Protein, ur: 30 mg/dL — AB
Specific Gravity, Urine: 1.033 — ABNORMAL HIGH (ref 1.005–1.030)
Urobilinogen, UA: 0.2 mg/dL (ref 0.0–1.0)
pH: 5.5 (ref 5.0–8.0)

## 2011-08-09 LAB — URINE MICROSCOPIC-ADD ON

## 2011-08-09 LAB — GLUCOSE, CAPILLARY

## 2011-08-10 LAB — URINE CULTURE
Colony Count: NO GROWTH
Culture: NO GROWTH

## 2013-09-26 ENCOUNTER — Emergency Department: Payer: BC Managed Care – PPO

## 2013-09-26 ENCOUNTER — Emergency Department
Admission: EM | Admit: 2013-09-26 | Discharge: 2013-09-26 | Disposition: A | Discharge status: Home / Self Care | Payer: BC Managed Care – PPO | Attending: Emergency Medicine | Admitting: Emergency Medicine

## 2013-09-26 DIAGNOSIS — N200 Calculus of kidney: Secondary | ICD-10-CM | POA: Insufficient documentation

## 2013-09-26 DIAGNOSIS — R109 Unspecified abdominal pain: Secondary | ICD-10-CM | POA: Insufficient documentation

## 2013-09-26 HISTORY — DX: Pure hypercholesterolemia, unspecified: E78.00

## 2013-09-26 HISTORY — DX: Type 2 diabetes mellitus without complications: E11.9

## 2013-09-26 LAB — CBC AND DIFFERENTIAL
Basophils Absolute Automated: 0.03 10*3/uL (ref 0.00–0.20)
Basophils Automated: 0 %
Eosinophils Absolute Automated: 0.13 10*3/uL (ref 0.00–0.70)
Eosinophils Automated: 2 %
Hematocrit: 42.6 % (ref 42.0–52.0)
Hgb: 15.5 g/dL (ref 13.0–17.0)
Immature Granulocytes Absolute: 0.01 10*3/uL
Immature Granulocytes: 0 %
Lymphocytes Absolute Automated: 2.38 10*3/uL (ref 0.50–4.40)
Lymphocytes Automated: 33 %
MCH: 29.5 pg (ref 28.0–32.0)
MCHC: 36.4 g/dL — ABNORMAL HIGH (ref 32.0–36.0)
MCV: 81.1 fL (ref 80.0–100.0)
MPV: 10.7 fL (ref 9.4–12.3)
Monocytes Absolute Automated: 0.58 10*3/uL (ref 0.00–1.20)
Monocytes: 8 %
Neutrophils Absolute: 4.1 10*3/uL (ref 1.80–8.10)
Neutrophils: 57 %
Nucleated RBC: 0 /100 WBC (ref 0–1)
Platelets: 285 10*3/uL (ref 140–400)
RBC: 5.25 10*6/uL (ref 4.70–6.00)
RDW: 12 % (ref 12–15)
WBC: 7.22 10*3/uL (ref 3.50–10.80)

## 2013-09-26 LAB — URINALYSIS, REFLEX TO MICROSCOPIC EXAM IF INDICATED
Bilirubin, UA: NEGATIVE
Blood, UA: NEGATIVE
Glucose, UA: 500 — AB
Leukocyte Esterase, UA: NEGATIVE
Nitrite, UA: NEGATIVE
Protein, UR: NEGATIVE
Specific Gravity UA: 1.024 (ref 1.001–1.035)
Urine pH: 6 (ref 5.0–8.0)
Urobilinogen, UA: NEGATIVE mg/dL

## 2013-09-26 LAB — COMPREHENSIVE METABOLIC PANEL
ALT: 25 U/L (ref 0–55)
AST (SGOT): 15 U/L (ref 5–34)
Albumin/Globulin Ratio: 1.5 (ref 0.9–2.2)
Albumin: 4 g/dL (ref 3.5–5.0)
Alkaline Phosphatase: 150 U/L — ABNORMAL HIGH (ref 38–106)
Anion Gap: 10 (ref 5.0–15.0)
BUN: 14 mg/dL (ref 9.0–28.0)
Bilirubin, Total: 0.9 mg/dL (ref 0.2–1.2)
CO2: 25 mEq/L (ref 22–29)
Calcium: 9.7 mg/dL (ref 8.5–10.5)
Chloride: 102 mEq/L (ref 100–111)
Creatinine: 0.9 mg/dL (ref 0.7–1.3)
Globulin: 2.7 g/dL (ref 2.0–3.6)
Glucose: 281 mg/dL — ABNORMAL HIGH (ref 70–100)
Potassium: 4.4 mEq/L (ref 3.5–5.1)
Protein, Total: 6.7 g/dL (ref 6.0–8.3)
Sodium: 137 mEq/L (ref 136–145)

## 2013-09-26 LAB — GFR: EGFR: >60

## 2013-09-26 LAB — HEMOLYSIS INDEX: Hemolysis Index: 13 (ref 0–18)

## 2013-09-26 LAB — LIPASE: Lipase: 202 U/L — ABNORMAL HIGH (ref 8–78)

## 2013-09-26 MED ORDER — HYDROMORPHONE HCL PF 1 MG/ML IJ SOLN
1.00 mg | Freq: Once | INTRAMUSCULAR | Status: AC
Start: 2013-09-26 — End: 2013-09-26
  Administered 2013-09-26: 1 mg via INTRAVENOUS
  Filled 2013-09-26: qty 1

## 2013-09-26 MED ORDER — KETOROLAC TROMETHAMINE 30 MG/ML IJ SOLN
30.00 mg | Freq: Once | INTRAMUSCULAR | Status: AC
Start: 2013-09-26 — End: 2013-09-26
  Administered 2013-09-26: 30 mg via INTRAVENOUS
  Filled 2013-09-26: qty 1

## 2013-09-26 MED ORDER — ONDANSETRON 4 MG PO TBDP
4.00 mg | ORAL_TABLET | Freq: Four times a day (QID) | ORAL | Status: AC | PRN
Start: 2013-09-26 — End: 2013-09-29

## 2013-09-26 MED ORDER — SODIUM CHLORIDE 0.9 % IV SOLN
100.00 mL/h | INTRAVENOUS | Status: AC
Start: 2013-09-26 — End: 2013-09-26
  Administered 2013-09-26: 100 mL/h via INTRAVENOUS

## 2013-09-26 MED ORDER — ONDANSETRON HCL 4 MG/2ML IJ SOLN
4.00 mg | Freq: Once | INTRAMUSCULAR | Status: AC
Start: 2013-09-26 — End: 2013-09-26
  Administered 2013-09-26: 4 mg via INTRAVENOUS
  Filled 2013-09-26: qty 2

## 2013-09-26 MED ORDER — OXYCODONE-ACETAMINOPHEN 5-325 MG PO TABS
1.00 | ORAL_TABLET | Freq: Four times a day (QID) | ORAL | Status: DC | PRN
Start: 2013-09-26 — End: 2017-05-05

## 2013-09-26 NOTE — ED Provider Notes (Signed)
Physician/Midlevel provider first contact with patient: 09/26/13 0309         Pt presents with left flank pain that has been going on for the past 2 weeks.  Pt states he has hx of multiple kidney stones in the past and has been passing stones over the past couple of weeks.  Pt with pain that got worse since yesterday.  No blood in the urine.  Pt took tylenol a few hours ago.  No vomiting.  Hx from the pt.    Review of Systems   Constitutional: Negative.    Respiratory: Negative.    Cardiovascular: Negative.    Gastrointestinal: Positive for nausea.   Genitourinary: Positive for flank pain.   Musculoskeletal: Negative.    All other systems reviewed and are negative.    Physical Exam   Constitutional: He is oriented to person, place, and time. He appears distressed.   HENT:   Head: Normocephalic and atraumatic.   Neck: Normal range of motion. Neck supple.   Cardiovascular: Normal rate and regular rhythm.    Pulmonary/Chest: Effort normal and breath sounds normal.   Abdominal: Soft. He exhibits no distension. There is no tenderness.   Musculoskeletal:   Left cva tenderness   Neurological: He is alert and oriented to person, place, and time. GCS score is 15.   Skin: He is not diaphoretic.   Nursing note and vitals reviewed.    Oxygen saturations are 96%    4:24 AM  Pt feeling a lot better, but still with some pain.  Will re -medicate.    6:15 AM  Discussed results.  No obstructing stone.  Pt with slightly elevated lipase, but no abd tenderness and no signs of pancreatitis on CT scan.  Pain in part may be due to mesenteric adenitis on the left side.  Will rx pain meds and advised pt to f/u with PMD.    Impression:  1. Left flank pain    2. Kidney stone          Jethro Bastos, MD  09/26/13 [phone]

## 2013-09-26 NOTE — Discharge Instructions (Signed)
Flank Pain    You have been diagnosed with Flank Pain.    Flank Pain means that you are having pain in your side. Flank pain can have many causes. Some of the most common causes are:     Kidney stones.   A pulled muscle (muscle strain).   Kidney infection (pyelonephritis).   Pneumonia (pain going down from the lungs).   A viral skin disease called shingles.   A gallbladder problem (gall stones) if the pain is on your right side.   In rare cases, a more serious or dangerous condition can cause flank pain.    During your visit, you had a CT scan done to help evaluate your pain. The scan did not show any serious problems. It also did not tell us the cause of your pain.    It is not clear today why you are having flank pain. Your doctor did an exam and a work-up and does not feel that the cause of your flank pain is life-threatening. You might need another exam or more tests to find out why you have these symptoms. At this time, the cause of your symptoms does not seem dangerous and you don t need to stay in the hospital.    We don't believe your condition is dangerous right now. However, you need to be careful. Sometimes a problem that seems small can get serious later. Therefore, it is very important for you to come back here or go to the nearest Emergency Department if you don t get better or your symptoms get worse.     Some things you can try at home are:     Get plenty of rest.   Over-the-counter pain medications like ibuprofen (Advil or Motrin), naproxen (Aleve, Naprosyn), or acetaminophen (Tylenol).   Warm compresses.   Drink lots of liquids.   Follow the instructions for any medication you get prescribed.    Follow up with a doctor right away.    YOU SHOULD SEEK MEDICAL ATTENTION IMMEDIATELY, EITHER HERE OR AT THE NEAREST EMERGENCY DEPARTMENT, IF ANY OF THE FOLLOWING OCCURS:     You have a fever (temperature higher than 100.4F or 38C).   Your pain does not go away or gets  worse.   You can t keep fluids down or throw up many times.   You start having pain that spreads from your flank to the rest of your abdomen (belly).   You don t feel better after treatment.   Any other symptoms, concerns, or not getting better as expected.    If you can t follow up with your doctor, or if at any time you feel you need to be rechecked or seen again, come back here or go to the nearest emergency department.

## 2013-09-26 NOTE — ED Notes (Signed)
Patient with hx of kidney stones and multiple lithotripsy procedures in the past who presents with L sided flank pain radiating down L groin that began weeks ago, worsening tonight. No nausea or vomiting. Recently passed a stone.

## 2014-02-11 ENCOUNTER — Emergency Department
Admission: EM | Admit: 2014-02-11 | Discharge: 2014-02-11 | Disposition: A | Discharge status: Home / Self Care | Payer: BC Managed Care – PPO | Attending: Internal Medicine | Admitting: Internal Medicine

## 2014-02-11 ENCOUNTER — Emergency Department: Payer: BC Managed Care – PPO

## 2014-02-11 DIAGNOSIS — I10 Essential (primary) hypertension: Secondary | ICD-10-CM | POA: Insufficient documentation

## 2014-02-11 DIAGNOSIS — N200 Calculus of kidney: Secondary | ICD-10-CM

## 2014-02-11 DIAGNOSIS — E119 Type 2 diabetes mellitus without complications: Secondary | ICD-10-CM | POA: Insufficient documentation

## 2014-02-11 DIAGNOSIS — N202 Calculus of kidney with calculus of ureter: Secondary | ICD-10-CM | POA: Insufficient documentation

## 2014-02-11 DIAGNOSIS — E780 Pure hypercholesterolemia: Secondary | ICD-10-CM | POA: Insufficient documentation

## 2014-02-11 DIAGNOSIS — Z87442 Personal history of urinary calculi: Secondary | ICD-10-CM | POA: Insufficient documentation

## 2014-02-11 HISTORY — DX: Calculus of kidney: N20.0

## 2014-02-11 HISTORY — DX: Essential (primary) hypertension: I10

## 2014-02-11 LAB — CBC AND DIFFERENTIAL
Basophils Absolute Automated: 0.03 10*3/uL (ref 0.00–0.20)
Basophils Automated: 0 %
Eosinophils Absolute Automated: 0.04 10*3/uL (ref 0.00–0.70)
Eosinophils Automated: 1 %
Hematocrit: 45.4 % (ref 42.0–52.0)
Hgb: 16.4 g/dL (ref 13.0–17.0)
Immature Granulocytes Absolute: 0.02 10*3/uL
Immature Granulocytes: 0 %
Lymphocytes Absolute Automated: 1.18 10*3/uL (ref 0.50–4.40)
Lymphocytes Automated: 17 %
MCH: 29.7 pg (ref 28.0–32.0)
MCHC: 36.1 g/dL — ABNORMAL HIGH (ref 32.0–36.0)
MCV: 82.2 fL (ref 80.0–100.0)
MPV: 10.6 fL (ref 9.4–12.3)
Monocytes Absolute Automated: 0.43 10*3/uL (ref 0.00–1.20)
Monocytes: 6 %
Neutrophils Absolute: 5.38 10*3/uL (ref 1.80–8.10)
Neutrophils: 76 %
Nucleated RBC: 0 /100 WBC (ref 0–1)
Platelets: 297 10*3/uL (ref 140–400)
RBC: 5.52 10*6/uL (ref 4.70–6.00)
RDW: 12 % (ref 12–15)
WBC: 7.06 10*3/uL (ref 3.50–10.80)

## 2014-02-11 LAB — URINALYSIS, REFLEX TO MICROSCOPIC EXAM IF INDICATED
Bilirubin, UA: NEGATIVE
Glucose, UA: 500 — AB
Leukocyte Esterase, UA: NEGATIVE
Nitrite, UA: NEGATIVE
Protein, UR: 30 — AB
Specific Gravity UA: 1.028 (ref 1.001–1.035)
Urine pH: 8 (ref 5.0–8.0)
Urobilinogen, UA: NEGATIVE mg/dL

## 2014-02-11 LAB — COMPREHENSIVE METABOLIC PANEL
ALT: 32 U/L (ref 0–55)
AST (SGOT): 19 U/L (ref 5–34)
Albumin/Globulin Ratio: 1.8 (ref 0.9–2.2)
Albumin: 4.4 g/dL (ref 3.5–5.0)
Alkaline Phosphatase: 144 U/L — ABNORMAL HIGH (ref 38–106)
Anion Gap: 15 (ref 5.0–15.0)
BUN: 13 mg/dL (ref 9.0–28.0)
Bilirubin, Total: 1 mg/dL (ref 0.2–1.2)
CO2: 21 mEq/L — ABNORMAL LOW (ref 22–29)
Calcium: 10.2 mg/dL (ref 8.5–10.5)
Chloride: 100 mEq/L (ref 100–111)
Creatinine: 1.5 mg/dL — ABNORMAL HIGH (ref 0.7–1.3)
Globulin: 2.4 g/dL (ref 2.0–3.6)
Glucose: 483 mg/dL — ABNORMAL HIGH (ref 70–100)
Potassium: 4.8 mEq/L (ref 3.5–5.1)
Protein, Total: 6.8 g/dL (ref 6.0–8.3)
Sodium: 136 mEq/L (ref 136–145)

## 2014-02-11 LAB — GFR: EGFR: 50.1

## 2014-02-11 LAB — HEMOLYSIS INDEX: Hemolysis Index: 9 (ref 0–18)

## 2014-02-11 MED ORDER — OXYCODONE-ACETAMINOPHEN 5-325 MG PO TABS
ORAL_TABLET | ORAL | Status: DC
Start: 2014-02-11 — End: 2017-05-05

## 2014-02-11 MED ORDER — SODIUM CHLORIDE 0.9 % IV BOLUS
1000.0000 mL | Freq: Once | INTRAVENOUS | Status: AC
Start: 2014-02-11 — End: 2014-02-11
  Administered 2014-02-11: 1000 mL via INTRAVENOUS

## 2014-02-11 MED ORDER — ONDANSETRON 4 MG PO TBDP
4.0000 mg | ORAL_TABLET | Freq: Four times a day (QID) | ORAL | Status: DC | PRN
Start: 2014-02-11 — End: 2019-04-29

## 2014-02-11 MED ORDER — MORPHINE SULFATE 2 MG/ML IJ/IV SOLN (WRAP)
4.0000 mg | Freq: Once | Status: AC
Start: 2014-02-11 — End: 2014-02-11
  Administered 2014-02-11: 4 mg via INTRAVENOUS
  Filled 2014-02-11: qty 2

## 2014-02-11 MED ORDER — ONDANSETRON HCL 4 MG/2ML IJ SOLN
4.0000 mg | Freq: Once | INTRAMUSCULAR | Status: AC
Start: 2014-02-11 — End: 2014-02-11
  Administered 2014-02-11: 4 mg via INTRAVENOUS
  Filled 2014-02-11: qty 2

## 2014-02-11 MED ORDER — HYDROMORPHONE HCL 1 MG/ML IJ SOLN
1.0000 mg | Freq: Once | INTRAMUSCULAR | Status: AC
Start: 2014-02-11 — End: 2014-02-11
  Administered 2014-02-11: 1 mg via INTRAVENOUS
  Filled 2014-02-11: qty 1

## 2014-02-11 NOTE — ED Notes (Signed)
Ricky Johnson is a 49 y.o. male c/o L flank pain. Pt stated he has hx of chronic kidney stones, for which he has had 14 lithotrypsies. Pt stated he took 1/2 percocet earlier today and 1g tylenol with some relief.

## 2014-02-11 NOTE — Discharge Instructions (Signed)
Kidney Stones    You have been seen for a kidney stone.    A kidney stone is a hard mineral and crystalline material (a lot like gravel). It forms inside the kidney or the urinary tract. When it moves from the kidney into the tube between the kidney and the bladder (the ureter), it causes severe pain. Kidney stones form when there is less urine (pee) or too many stone-forming substances in the urine. Normally, kidney stones are made of calcium oxalate or calcium phosphate. Kidney stones associated with infection in the urinary tract are called struvite or infection stones.    Symptoms include sharp pain in the side that may radiate (spread) to the groin. Nausea and vomiting are also common. A doctor diagnosed your kidney stone based on your exam, urine test, and medical history. The doctor may have run a special test called a helical CT stone study or an intravenous pyelogram (IVP).    Men get kidney stones more often than women. Whites get them more often than African-Americans. Kidney stones become more common when men reach their 40s. The risk gets higher with age. People who have already had more than one kidney stone often get more stones.    There are different conditions that can cause kidney stones: Hypercalcuria is an inherited (genetic) condition that causes high calcium in the urine. This causes stones in more than half of cases. There are other conditions that cause an increased risk of kidney stones. These include gout (a joint condition), hyperparathyroidism (a hormone condition), inflammatory bowel disease (Crohn's disease and Ulcerative colitis) and intestinal bypass surgery. They also include kidney diseases like renal tubular acidosis. Certain medicines also increase the risk of kidney stones. These include some diuretics (water pills), antacids with calcium, and the HIV drug indinavir (Crixivan).    Most kidney stones pass on their own. Larger stones or stones that don't pass in a few  days may need to be taken out. This is done by a urologist (a doctor who specializes in the urinary tract). Kidney stones are normally treated with pain and nausea medicine.    You should drink lots of fluid--up to 8 glasses of water a day. You should follow up with a urologist in the next 2-3 weeks. Strain all of your urine so you can catch the kidney stone as it passes out of the bladder. Keep the stone and bring it with you to your urology appointment. The urologist may have the stone tested to find out what it is made of. This may help the urologist recommend diet changes. He or she may also suggest medicines to help prevent more kidney stones.    YOU SHOULD SEEK MEDICAL ATTENTION IMMEDIATELY, EITHER HERE OR AT THE NEAREST EMERGENCY DEPARTMENT, IF ANY OF THE FOLLOWING OCCURS:   The pain gets worse or the medicine isn't enough to treat your pain.   You get sick (nausea) or vomit and can t keep down fluids or pain medicine.   You have a fever (temperature higher than 100.4F / 38C) or shaking chills.

## 2014-02-11 NOTE — ED Provider Notes (Signed)
EMERGENCY DEPARTMENT HISTORY AND PHYSICAL EXAM     Physician/Midlevel provider first contact with patient: 02/11/14 1528         Date: 02/11/2014  Patient Name: Ricky Johnson    History of Presenting Illness     Chief Complaint   Patient presents with   . Nephrolithiasis       History Provided By: Patient    Chief Complaint: Flank Pain  Onset: 2 weeks ago  Timing: Intermittent becoming constant over the past few days  Location: Left  Quality: Similar to previous kidney stone pain  Modifying Factors: Attempted Percocet with mild relief  Associated Symptoms: Nausea    Additional History: Ricky Johnson is a 49 y.o. male with a Hx of kidney stones, DM, and HTN presenting to the ED for intermittent left flank pain for the past 2 weeks. The pt states that he has been able to manage the pain up until the past couple of days when the pain has become more constant. He states that the pain has begun to radiate to his LLQ and it is similar to the pain of his previous kidney stones. Pt attempted Percocet with only some relief. He also reports nausea but no vomiting. Pt has ongoing diarrhea that he attributes to his Metformin. Pt denies any fever, dysuria, or hematuria.    PCP: Franne Grip, MD      No current facility-administered medications for this encounter.     Current Outpatient Prescriptions   Medication Sig Dispense Refill   . glimepiride (AMARYL) 2 MG tablet Take 2 mg by mouth every morning before breakfast.     . atorvastatin (LIPITOR) 20 MG tablet Take 20 mg by mouth daily.     . metFORMIN (GLUCOPHAGE) 1000 MG tablet Take 1,000 mg by mouth 2 (two) times daily with meals.     . ondansetron (ZOFRAN ODT) 4 MG disintegrating tablet Take 1 tablet (4 mg total) by mouth every 6 (six) hours as needed for Nausea. 8 tablet 0   . oxyCODONE-acetaminophen (PERCOCET) 5-325 MG per tablet Take 1 tablet by mouth every 6 (six) hours as needed for Pain. 20 tablet 0   . oxyCODONE-acetaminophen (PERCOCET) 5-325 MG per tablet 1-2 tablets by  mouth every 4-6 hours as needed for pain;  Do not drive or operate machinery while taking this medicine 20 tablet 0       Past History     Past Medical History:  Past Medical History   Diagnosis Date   . High cholesterol    . Diabetes mellitus    . Hypertension    . Kidney stone      x 15       Past Surgical History:  Past Surgical History   Procedure Laterality Date   . Lithotripsy     . Tonsillectomy         Family History:  History reviewed. No pertinent family history.    Social History:  History   Substance Use Topics   . Smoking status: Never Smoker    . Smokeless tobacco: Not on file   . Alcohol Use: No       Allergies:  No Known Allergies    Review of Systems     Review of Systems   Constitutional: Negative for fever.   HENT: Negative for nosebleeds.    Eyes: Negative for discharge.   Respiratory: Negative for shortness of breath.    Cardiovascular: Negative for chest pain.   Gastrointestinal: Positive for nausea,  abdominal pain (LLQ) and diarrhea (ongoing). Negative for vomiting.   Genitourinary: Positive for flank pain (left). Negative for dysuria and hematuria.   Skin: Negative for rash.   Endo/Heme/Allergies:        No known allergies   Psychiatric/Behavioral: Negative for suicidal ideas.         Physical Exam   BP 148/93 mmHg  Pulse 101  Temp(Src) 99 F (37.2 C) (Oral)  Resp 18  SpO2 95%  Physical Exam   Constitutional: He is oriented to person, place, and time. He appears well-developed and well-nourished. He appears distressed.   HENT:   Head: Normocephalic and atraumatic.   Eyes: Conjunctivae are normal. Right eye exhibits no discharge. Left eye exhibits no discharge. No scleral icterus.   Neck: Normal range of motion. Neck supple.   Cardiovascular: Normal rate, regular rhythm and normal heart sounds.    Pulmonary/Chest: Effort normal and breath sounds normal. No respiratory distress. He has no wheezes. He has no rales.   Abdominal: Soft. Bowel sounds are normal. He exhibits no distension. There  is no tenderness. There is no rebound and no guarding.   Musculoskeletal: Normal range of motion. He exhibits no edema or tenderness.   Neurological: He is alert and oriented to person, place, and time.   Skin: Skin is warm and dry. He is not diaphoretic.   Psychiatric: He has a normal mood and affect. His behavior is normal. Judgment and thought content normal.   Nursing note and vitals reviewed.        Diagnostic Study Results     Labs -     Results     Procedure Component Value Units Date/Time    UA, Reflex to Microscopic [[id number]]  (Abnormal) Collected:  02/11/14 1540    Specimen Information:  Urine Updated:  02/11/14 1621     Urine Type Clean Catch      Color, UA Yellow      Clarity, UA Clear      Specific Gravity UA 1.028      Urine pH 8.0      Leukocyte Esterase, UA Negative      Nitrite, UA Negative      Protein, UR 30 (A)      Glucose, UA 500 (A)      Ketones UA Trace (A)      Urobilinogen, UA Negative mg/dL      Bilirubin, UA Negative      Blood, UA Moderate (A)      RBC, UA TNTC (A) /hpf      WBC, UA 0 - 5 /hpf      Yeast, UA Rare (A)      Urine Mucus Present     Comprehensive metabolic panel [[id number]]  (Abnormal) Collected:  02/11/14 1540    Specimen Information:  Blood Updated:  02/11/14 1609     Glucose 483 (H) mg/dL      BUN 78.2 mg/dL      Creatinine 1.5 (H) mg/dL      Sodium 956 mEq/L      Potassium 4.8 mEq/L      Chloride 100 mEq/L      CO2 21 (L) mEq/L      CALCIUM 10.2 mg/dL      Protein, Total 6.8 g/dL      Albumin 4.4 g/dL      AST (SGOT) 19 U/L      ALT 32 U/L      Alkaline Phosphatase 144 (H) U/L  Bilirubin, Total 1.0 mg/dL      Globulin 2.4 g/dL      Albumin/Globulin Ratio 1.8      Anion Gap 15.0     Hemolysis index [[id number]] Collected:  02/11/14 1540     Hemolysis Index 9 Updated:  02/11/14 1609    GFR [[id number]] Collected:  02/11/14 1540     EGFR 50.1 Updated:  02/11/14 1609    CBC with differential [[id number]]  (Abnormal) Collected:  02/11/14 1540    Specimen Information:   Blood / Blood Updated:  02/11/14 1554     WBC 7.06 x10 3/uL      Hgb 16.4 g/dL      Hematocrit 29.5 %      Platelets 297 x10 3/uL      RBC 5.52 x10 6/uL      MCV 82.2 fL      MCH 29.7 pg      MCHC 36.1 (H) g/dL      RDW 12 %      MPV 10.6 fL      Neutrophils 76 %      Lymphocytes Automated 17 %      Monocytes 6 %      Eosinophils Automated 1 %      Basophils Automated 0 %      Immature Granulocyte 0 %      Nucleated RBC 0 /100 WBC      Neutrophils Absolute 5.38 x10 3/uL      Abs Lymph Automated 1.18 x10 3/uL      Abs Mono Automated 0.43 x10 3/uL      Abs Eos Automated 0.04 x10 3/uL      Absolute Baso Automated 0.03 x10 3/uL      Absolute Immature Granulocyte 0.02 x10 3/uL           Radiologic Studies -   Radiology Results (24 Hour)     Procedure Component Value Units Date/Time    CT Abd/Pelvis without Contrast [[id number]] Collected:  02/11/14 1627    Order Status:  Completed Updated:  02/11/14 1634    Narrative:      CT ABDOMEN PELVIS WO IV/ WO PO CONT: 02/11/2014 4:13 PM       INDICATION:    .left flank pain and left lower abd pain    COMPARISON: None.    TECHNIQUE: transaxial images were obtained from the lung bases  through the pubic symphysis without oral or intravenous contrast.       Multiplanar images were created at the CT scanner.    FINDINGS:     This examination is limited for the evaluation of solid organs and  vascular structures due to the lack of intravenous contrast which is  standard for urinary calculus assessment CT.  The lung bases are clear gallbladder is unremarkable  The liver is within normal limits.  The spleen is within normal limits.  The pancreas is within normal limits.  The adrenal glands are within normal limits.    No retroperitoneal lymphadenopathy.  No mesenteric lymphadenopathy.    No free fluid in the abdomen.         Right kidney & ureter:    - Kidney - 5 or more calculi. Size range several 1 to 3 mm stones are  seen through the upper, mid and lower poles of the right kidney  -  Ureter - 1 calculi. Size there is a 3 mm stone seen along the course  of the left ureter just proximal  to the left UVJ on image 126  - Obstruction / hydronephrosis - no significant upstream hydronephrosis  or perinephric stranding or ureterectasis is seen    Left kidney & ureter:    - Kidney - 5 or more calculi.Size range several 1 to 3 mm stones are  seen distributed through the upper, mid and lower polar regions of left  kidney  - Ureter - No calculi. Size not applicable.   - Obstruction / hydronephrosis - right ureterectasis is noted. No  perinephric stranding or hydronephrosis is noted    Other: No other significant abnormalities of the upper urinary tract.    The unopacified bowel is unobstructed. The appendix is normal. Moderate  amount of stool seen in the colon.      Pelvic Findings  There is no bony destructive lesion    The prostate is mildly enlarged. Moderate degenerative changes of the  spine are noted. Small amount of fat is seen in each inguinal canal    No free fluid in the pelvis.   No pelvic lymphadenopathy. No inguinal lymphadenopathy.    Urinary bladder:    - No calculi. Size not applicable.   - Bladder is distended adequately.  - Wall is thin.          Impression:           1. Bilateral nephrolithiasis. 3 mm minimally obstructing stone of the  left distal ureter just proximal to the left UVJ    Laurena Slimmer, MD   02/11/2014 4:30 PM        .      Medical Decision Making   I am the first provider for this patient.    I reviewed the vital signs, available nursing notes, past medical history, past surgical history, family history and social history.    Vital Signs-Reviewed the patient's vital signs.     No data found.      Pulse Oximetry Analysis - Normal 95% on RA    Old Medical Records: Nursing notes.     ED Course:     4:38 PM - Pt's nausea is better but states his pain has only improved slightly after meds.  5:11 PM - Pt is feeling better after meds. He states that he has not been taking his  Metformin like he should and he agreeable to f/u with his PMD for DM maintenance. Discussed test results with pt and counseled on diagnosis, f/u plans, medication use, and signs and symptoms when to return to ED. Pt will f/u with his urologist. Pt is stable and ready for discharge.      Provider Notes:     Diagnosis     Clinical Impression:   1. Kidney stones    2. Diabetes type two     _______________________________    Attestations:  This note is prepared by Robbie Lis, acting as Scribe for Avanell Shackleton, MD.    Avanell Shackleton, MD. The scribe's documentation has been prepared under my direction and personally reviewed by me in its entirety.  I confirm that the note above accurately reflects all work, treatment, procedures, and medical decision making performed by me.    _______________________________        Azzie Glatter, MD  02/13/14 0130

## 2014-07-18 ENCOUNTER — Emergency Department (HOSPITAL_COMMUNITY)
Admission: EM | Admit: 2014-07-18 | Discharge: 2014-07-18 | Disposition: A | Discharge status: Home / Self Care | Payer: BLUE CROSS/BLUE SHIELD | Attending: Emergency Medicine | Admitting: Emergency Medicine

## 2014-07-18 ENCOUNTER — Encounter (HOSPITAL_COMMUNITY): Payer: Self-pay

## 2014-07-18 ENCOUNTER — Emergency Department (HOSPITAL_COMMUNITY): Payer: BLUE CROSS/BLUE SHIELD

## 2014-07-18 DIAGNOSIS — Z7982 Long term (current) use of aspirin: Secondary | ICD-10-CM | POA: Insufficient documentation

## 2014-07-18 DIAGNOSIS — Z8719 Personal history of other diseases of the digestive system: Secondary | ICD-10-CM | POA: Insufficient documentation

## 2014-07-18 DIAGNOSIS — Z872 Personal history of diseases of the skin and subcutaneous tissue: Secondary | ICD-10-CM | POA: Insufficient documentation

## 2014-07-18 DIAGNOSIS — R000 Tachycardia, unspecified: Secondary | ICD-10-CM | POA: Diagnosis not present

## 2014-07-18 DIAGNOSIS — Z79899 Other long term (current) drug therapy: Secondary | ICD-10-CM | POA: Insufficient documentation

## 2014-07-18 DIAGNOSIS — Z9889 Other specified postprocedural states: Secondary | ICD-10-CM | POA: Diagnosis not present

## 2014-07-18 DIAGNOSIS — Z87828 Personal history of other (healed) physical injury and trauma: Secondary | ICD-10-CM | POA: Diagnosis not present

## 2014-07-18 DIAGNOSIS — N200 Calculus of kidney: Secondary | ICD-10-CM

## 2014-07-18 DIAGNOSIS — E119 Type 2 diabetes mellitus without complications: Secondary | ICD-10-CM | POA: Insufficient documentation

## 2014-07-18 LAB — CBC
HEMATOCRIT: 42.4 % (ref 39.0–52.0)
HEMOGLOBIN: 15.5 g/dL (ref 13.0–17.0)
MCH: 29.1 pg (ref 26.0–34.0)
MCHC: 36.6 g/dL — ABNORMAL HIGH (ref 30.0–36.0)
MCV: 79.5 fL (ref 78.0–100.0)
Platelets: 295 10*3/uL (ref 150–400)
RBC: 5.33 MIL/uL (ref 4.22–5.81)
RDW: 12.1 % (ref 11.5–15.5)
WBC: 9 10*3/uL (ref 4.0–10.5)

## 2014-07-18 LAB — BASIC METABOLIC PANEL
ANION GAP: 14 (ref 5–15)
BUN: 11 mg/dL (ref 6–20)
CALCIUM: 10.1 mg/dL (ref 8.9–10.3)
CHLORIDE: 100 mmol/L — AB (ref 101–111)
CO2: 22 mmol/L (ref 22–32)
Creatinine, Ser: 1.16 mg/dL (ref 0.61–1.24)
Glucose, Bld: 251 mg/dL — ABNORMAL HIGH (ref 65–99)
Potassium: 4.7 mmol/L (ref 3.5–5.1)
SODIUM: 136 mmol/L (ref 135–145)

## 2014-07-18 LAB — URINALYSIS, ROUTINE W REFLEX MICROSCOPIC
Bilirubin Urine: NEGATIVE
Glucose, UA: 250 mg/dL — AB
NITRITE: NEGATIVE
PH: 7.5 (ref 5.0–8.0)
PROTEIN: 100 mg/dL — AB
SPECIFIC GRAVITY, URINE: 1.022 (ref 1.005–1.030)
UROBILINOGEN UA: 1 mg/dL (ref 0.0–1.0)

## 2014-07-18 LAB — URINE MICROSCOPIC-ADD ON

## 2014-07-18 MED ORDER — KETOROLAC TROMETHAMINE 10 MG PO TABS: 10.0000 mg | ORAL_TABLET | Freq: Four times a day (QID) | ORAL | Status: AC | PRN

## 2014-07-18 MED ORDER — SODIUM CHLORIDE 0.9 % IV BOLUS (SEPSIS)
1000.0000 mL | Freq: Once | INTRAVENOUS | Status: AC
  Administered 2014-07-18: 1000 mL via INTRAVENOUS

## 2014-07-18 MED ORDER — ONDANSETRON HCL 4 MG/2ML IJ SOLN
4.0000 mg | Freq: Once | INTRAMUSCULAR | Status: AC
  Administered 2014-07-18: 4 mg via INTRAVENOUS
  Filled 2014-07-18: qty 2

## 2014-07-18 MED ORDER — KETOROLAC TROMETHAMINE 30 MG/ML IJ SOLN
30.0000 mg | Freq: Once | INTRAMUSCULAR | Status: AC
  Administered 2014-07-18: 30 mg via INTRAVENOUS
  Filled 2014-07-18: qty 1

## 2014-07-18 MED ORDER — HYDROMORPHONE HCL 1 MG/ML IJ SOLN: 1.0000 mg | Freq: Once | INTRAMUSCULAR | Status: DC

## 2014-07-18 MED ORDER — HYDROMORPHONE HCL 1 MG/ML IJ SOLN
1.0000 mg | Freq: Once | INTRAMUSCULAR | Status: AC
  Administered 2014-07-18: 1 mg via INTRAVENOUS
  Filled 2014-07-18: qty 1

## 2014-07-18 MED ORDER — OXYCODONE-ACETAMINOPHEN 5-325 MG PO TABS: 2.0000 | ORAL_TABLET | ORAL | Status: AC | PRN

## 2014-07-18 MED ORDER — ONDANSETRON HCL 4 MG PO TABS: 4.0000 mg | ORAL_TABLET | Freq: Four times a day (QID) | ORAL | Status: AC

## 2014-07-18 MED ORDER — CIPROFLOXACIN HCL 500 MG PO TABS: 500.0000 mg | ORAL_TABLET | Freq: Two times a day (BID) | ORAL | Status: AC

## 2014-07-18 MED ORDER — HYDROMORPHONE HCL 1 MG/ML IJ SOLN
1.0000 mg | Freq: Once | INTRAMUSCULAR | Status: AC
  Administered 2014-07-18: 1 mg via INTRAVENOUS

## 2014-07-18 MED ORDER — PROMETHAZINE HCL 25 MG PO TABS: 25.0000 mg | ORAL_TABLET | Freq: Four times a day (QID) | ORAL | Status: AC | PRN

## 2014-07-18 MED ORDER — HYDROMORPHONE HCL 1 MG/ML IJ SOLN
1.0000 mg | INTRAMUSCULAR | Status: AC | PRN
  Administered 2014-07-18 (×2): 1 mg via INTRAVENOUS
  Filled 2014-07-18 (×3): qty 1

## 2014-07-18 NOTE — ED Notes (Signed)
Patient transported to Ultrasound 

## 2014-07-18 NOTE — ED Notes (Addendum)
Pt reports right-sided flank pain (states he knows he most likely has a kidney stone) onset 4 days ago. Hx of chronic kidney stones - 14 lithroscopies for multiple stones in past. Tried taking PO zofran, toradol, promethazine with no relief at home. Rates pain 8/10. Clots and blood in urine, per pt.

## 2014-07-18 NOTE — ED Notes (Signed)
Pt allowed to have ice chips per EDP Rubin Payor([place])

## 2014-07-18 NOTE — Discharge Instructions (Signed)
Lithotripsy for Kidney Stones °Lithotripsy is a treatment that can sometimes help eliminate kidney stones and pain that they cause. A form of lithotripsy, also known as extracorporeal shock wave lithotripsy, is a nonsurgical procedure that helps your body rid itself of the kidney stone when it is too big to pass on its own. Extracorporeal shock wave lithotripsy is a method of crushing a kidney stone with shock waves. These shock waves pass through your body and are focused on your stone. They cause the kidney stones to crumble while still in the urinary tract. It is then easier for the smaller pieces of stone to pass in the urine. °Lithotripsy usually takes about an hour. It is done in a hospital, a lithotripsy center, or a mobile unit. It usually does not require an overnight stay. Your health care provider will instruct you on preparation for the procedure. Your health care provider will tell you what to expect afterward. °LET YOUR HEALTH CARE PROVIDER KNOW ABOUT: °· Any allergies you have. °· All medicines you are taking, including vitamins, herbs, eye drops, creams, and over-the-counter medicines. °· Previous problems you or members of your family have had with the use of anesthetics. °· Any blood disorders you have. °· Previous surgeries you have had. °· Medical conditions you have. °RISKS AND COMPLICATIONS °Generally, lithotripsy for kidney stones is a safe procedure. However, as with any procedure, complications can occur. Possible complications include: °· Infection. °· Bleeding of the kidney. °· Bruising of the kidney or skin. °· Obstruction of the ureter. °· Failure of the stone to fragment. °BEFORE THE PROCEDURE °· Do not eat or drink for 6-8 hours prior to the procedure. You may, however, take the medications with a sip of water that your physician instructs you to take °· Do not take aspirin or aspirin-containing products for 7 days prior to your procedure °· Do not take nonsteroidal anti-inflammatory  products for 7 days prior to your procedure °PROCEDURE °A stent (flexible tube with holes) may be placed in your ureter. The ureter is the tube that transports the urine from the kidneys to the bladder. Your health care provider may place a stent before the procedure. This will help keep urine flowing from the kidney if the fragments of the stone block the ureter. You may have an IV tube placed in one of your veins to give you fluids and medicines. These medicines may help you relax or make you sleep. During the procedure, you will lie comfortably on a fluid-filled cushion or in a warm-water bath. After an X-ray or ultrasound exam to locate your stone, shock waves are aimed at the stone. If you are awake, you may feel a tapping sensation as the shock waves pass through your body. If large stone particles remain after treatment, a second procedure may be necessary at a later date. °For comfort during the test: °· Relax as much as possible. °· Try to remain still as much as possible. °· Try to follow instructions to speed up the test. °· Let your health care provider know if you are uncomfortable, anxious, or in pain. °AFTER THE PROCEDURE  °After surgery, you will be taken to the recovery area. A nurse will watch and check your progress. Once you're awake, stable, and taking fluids well, you will be allowed to go home as long as there are no problems. You will also be allowed to pass your urine before discharge. You may be given antibiotics to help prevent infection. You may also be prescribed   pain medicine if needed. In a week or two, your health care provider may remove your stent, if you have one. You may first have an X-ray exam to check on how successful the fragmentation of your stone has been and how much of the stone has passed. Your health care provider will check to see whether or not stone particles remain. °SEEK IMMEDIATE MEDICAL CARE IF: °· You develop a fever or shaking chills. °· Your pain is not  relieved by medicine. °· You feel sick to your stomach (nauseated) and you vomit. °· You develop heavy bleeding. °· You have difficulty urinating. °· You start to pass your stent from your penis. °Document Released: 01/02/2000 Document Revised: 10/25/2012 Document Reviewed: 07/20/2012 °[employer]® Patient Information ©2015 [employer]. This information is not intended to replace advice given to you by your health care provider. Make sure you discuss any questions you have with your health care provider. ° °

## 2014-07-18 NOTE — ED Notes (Signed)
Patient transported to X-ray 
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
## 2015-12-12 IMAGING — CR DG ABDOMEN 1V
1 series · 1 of 1 positions shown · non-contrast
Comparison: 02/23/2010

CLINICAL DATA: Pt reports right-sided flank pain (states he knows
he most likely has a kidney stone) onset 4 days ago. Hx of chronic
kidney stones - 14 lithroscopies for multiple stones in past.

EXAM:
ABDOMEN - 1 VIEW

[abdomen kub]
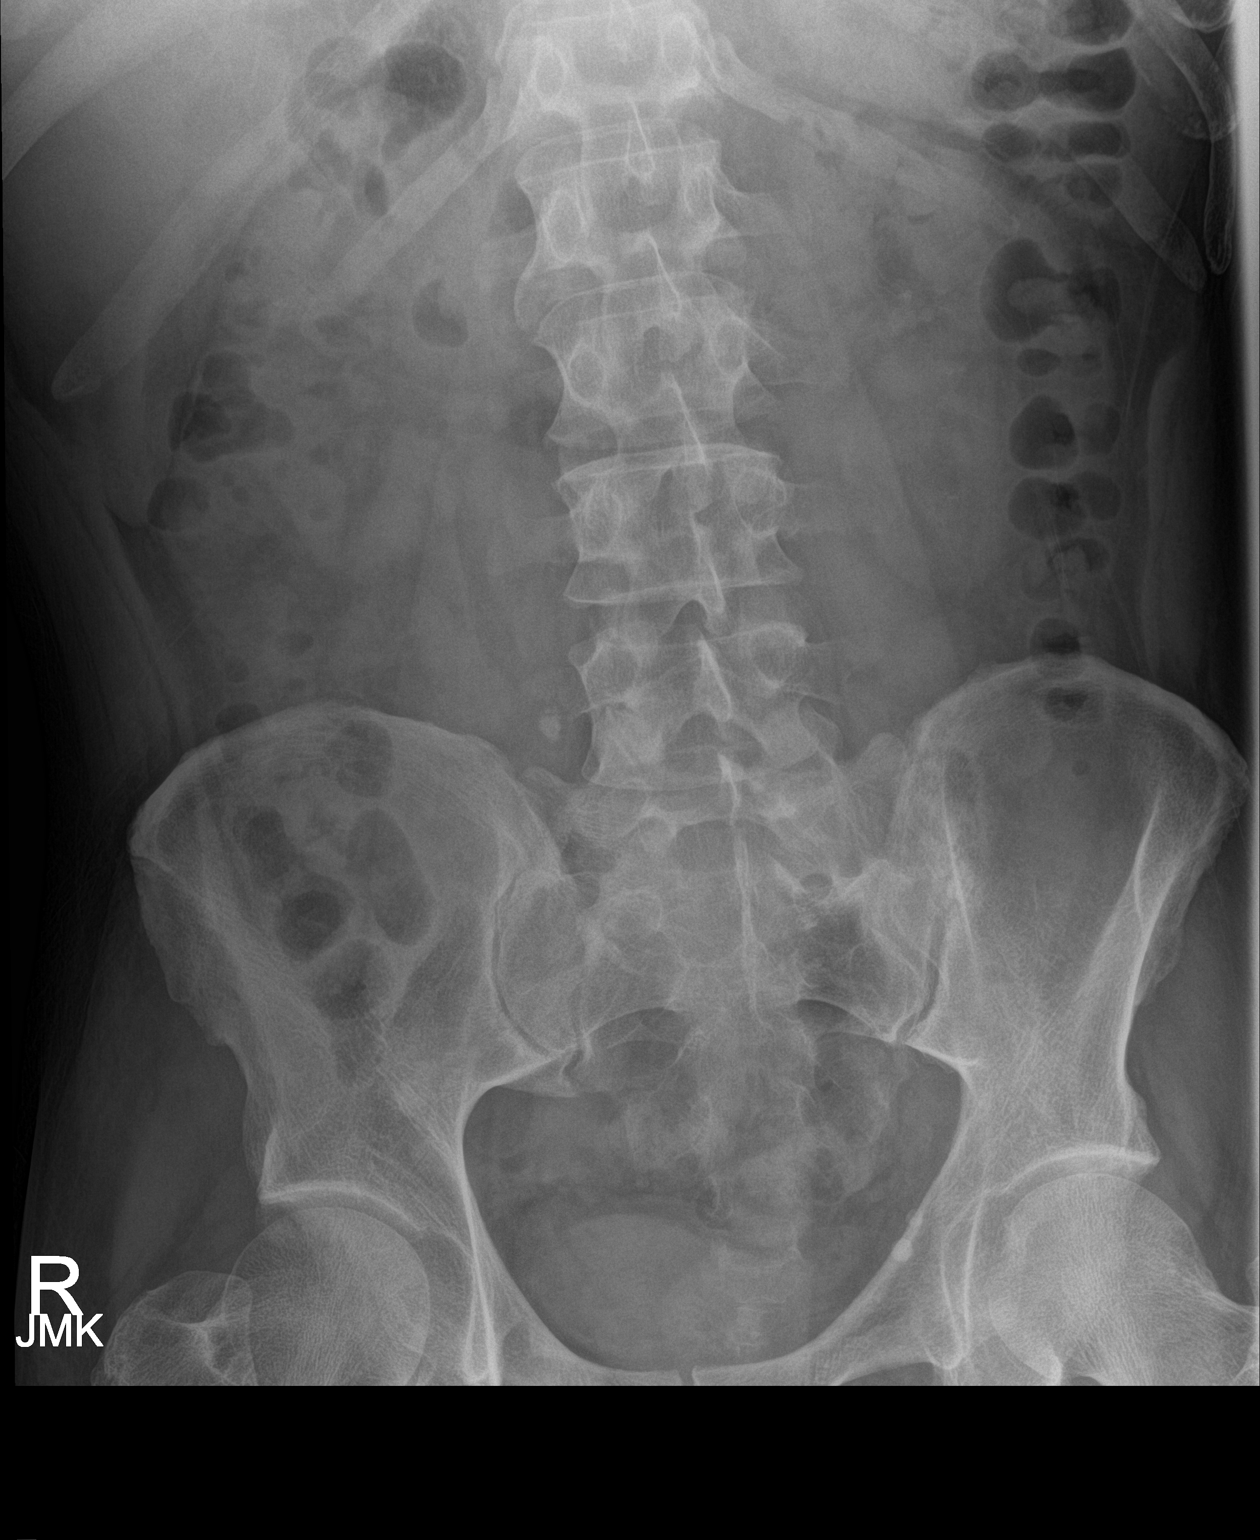

[1 of 1 positions shown; findings below may reference images not displayed]

FINDINGS: 6 mm stone projects over the inferior margin of the right L5
transverse process consistent with a mid ureteral stone. No other
evidence of a ureteral stone.a subtle density projects in the
midpole region of the left kidney consistent with an intrarenal
stone. No other evidence of an intrarenal stone.

There are several round stable phleboliths in the inferior pelvis.
No other soft tissue abnormality.

Normal bowel gas pattern.
IMPRESSION: 6 mm stone on the right projects in the expected location of the mid
right ureter.

## 2019-04-27 ENCOUNTER — Inpatient Hospital Stay
Admission: EM | Admit: 2019-04-27 | Discharge: 2019-04-29 | DRG: 463 | Disposition: A | Discharge status: Home / Self Care | Payer: Medicaid Other | Attending: Internal Medicine | Admitting: Internal Medicine

## 2019-04-27 DIAGNOSIS — N200 Calculus of kidney: Secondary | ICD-10-CM | POA: Diagnosis present

## 2019-04-27 DIAGNOSIS — Z7984 Long term (current) use of oral hypoglycemic drugs: Secondary | ICD-10-CM

## 2019-04-27 DIAGNOSIS — Z79899 Other long term (current) drug therapy: Secondary | ICD-10-CM

## 2019-04-27 DIAGNOSIS — N390 Urinary tract infection, site not specified: Principal | ICD-10-CM | POA: Diagnosis present

## 2019-04-27 DIAGNOSIS — R634 Abnormal weight loss: Secondary | ICD-10-CM | POA: Diagnosis present

## 2019-04-27 DIAGNOSIS — E785 Hyperlipidemia, unspecified: Secondary | ICD-10-CM | POA: Diagnosis present

## 2019-04-27 DIAGNOSIS — G43909 Migraine, unspecified, not intractable, without status migrainosus: Secondary | ICD-10-CM | POA: Diagnosis present

## 2019-04-27 DIAGNOSIS — R451 Restlessness and agitation: Secondary | ICD-10-CM | POA: Diagnosis present

## 2019-04-27 DIAGNOSIS — I10 Essential (primary) hypertension: Secondary | ICD-10-CM | POA: Diagnosis present

## 2019-04-27 DIAGNOSIS — R29703 NIHSS score 3: Secondary | ICD-10-CM | POA: Diagnosis present

## 2019-04-27 DIAGNOSIS — E1069 Type 1 diabetes mellitus with other specified complication: Secondary | ICD-10-CM

## 2019-04-27 DIAGNOSIS — Z681 Body mass index (BMI) 19 or less, adult: Secondary | ICD-10-CM

## 2019-04-27 DIAGNOSIS — G9341 Metabolic encephalopathy: Secondary | ICD-10-CM | POA: Diagnosis present

## 2019-04-27 DIAGNOSIS — Z91120 Patient's intentional underdosing of medication regimen due to financial hardship: Secondary | ICD-10-CM

## 2019-04-27 DIAGNOSIS — E871 Hypo-osmolality and hyponatremia: Secondary | ICD-10-CM | POA: Diagnosis present

## 2019-04-27 DIAGNOSIS — T383X6A Underdosing of insulin and oral hypoglycemic [antidiabetic] drugs, initial encounter: Secondary | ICD-10-CM | POA: Diagnosis present

## 2019-04-27 DIAGNOSIS — Z833 Family history of diabetes mellitus: Secondary | ICD-10-CM

## 2019-04-27 DIAGNOSIS — R600 Localized edema: Secondary | ICD-10-CM | POA: Diagnosis present

## 2019-04-27 DIAGNOSIS — R739 Hyperglycemia, unspecified: Secondary | ICD-10-CM

## 2019-04-27 DIAGNOSIS — E1165 Type 2 diabetes mellitus with hyperglycemia: Secondary | ICD-10-CM | POA: Diagnosis present

## 2019-04-27 DIAGNOSIS — B957 Other staphylococcus as the cause of diseases classified elsewhere: Secondary | ICD-10-CM | POA: Diagnosis present

## 2019-04-27 DIAGNOSIS — Z87442 Personal history of urinary calculi: Secondary | ICD-10-CM

## 2019-04-27 LAB — CBC AND DIFFERENTIAL
Absolute NRBC: 0 10*3/uL (ref 0.00–0.00)
Basophils Absolute Automated: 0.02 10*3/uL (ref 0.00–0.08)
Basophils Automated: 0.3 %
Eosinophils Absolute Automated: 0.04 10*3/uL (ref 0.00–0.44)
Eosinophils Automated: 0.6 %
Hematocrit: 35.9 % — ABNORMAL LOW (ref 37.6–49.6)
Hgb: 12.2 g/dL — ABNORMAL LOW (ref 12.5–17.1)
Immature Granulocytes Absolute: 0.04 10*3/uL (ref 0.00–0.07)
Immature Granulocytes: 0.6 %
Lymphocytes Absolute Automated: 1.27 10*3/uL (ref 0.42–3.22)
Lymphocytes Automated: 19.8 %
MCH: 27 pg (ref 25.1–33.5)
MCHC: 34 g/dL (ref 31.5–35.8)
MCV: 79.4 fL (ref 78.0–96.0)
MPV: 9.8 fL (ref 8.9–12.5)
Monocytes Absolute Automated: 0.58 10*3/uL (ref 0.21–0.85)
Monocytes: 9 %
Neutrophils Absolute: 4.46 10*3/uL (ref 1.10–6.33)
Neutrophils: 69.7 %
Nucleated RBC: 0 /100 WBC (ref 0.0–0.0)
Platelets: 391 10*3/uL — ABNORMAL HIGH (ref 142–346)
RBC: 4.52 10*6/uL (ref 4.20–5.90)
RDW: 13 % (ref 11–15)
WBC: 6.41 10*3/uL (ref 3.10–9.50)

## 2019-04-27 LAB — PT AND APTT
PT INR: 0.9 (ref 0.9–1.1)
PT: 9.9 s — ABNORMAL LOW (ref 10.1–12.9)
PTT: 29 s (ref 27–39)

## 2019-04-27 LAB — TYPE AND SCREEN
AB Screen Gel: NEGATIVE
ABO Rh: O POS

## 2019-04-27 LAB — URINALYSIS REFLEX TO MICROSCOPIC EXAM - REFLEX TO CULTURE
Bilirubin, UA: NEGATIVE
Blood, UA: NEGATIVE
Glucose, UA: >=500 — AB
Ketones UA: 5 — AB
Nitrite, UA: POSITIVE — AB
Protein, UR: NEGATIVE
Specific Gravity UA: 1.032 (ref 1.001–1.035)
Urine pH: 6 (ref 5.0–8.0)
Urobilinogen, UA: NORMAL mg/dL (ref 0.2–2.0)

## 2019-04-27 LAB — COMPREHENSIVE METABOLIC PANEL
ALT: 15 U/L (ref 0–55)
AST (SGOT): 10 U/L (ref 5–34)
Albumin/Globulin Ratio: 1.3 (ref 0.9–2.2)
Albumin: 3.9 g/dL (ref 3.5–5.0)
Alkaline Phosphatase: 151 U/L — ABNORMAL HIGH (ref 38–106)
Anion Gap: 10 (ref 5.0–15.0)
BUN: 12 mg/dL (ref 9.0–28.0)
Bilirubin, Total: 0.4 mg/dL (ref 0.2–1.2)
CO2: 27 mEq/L (ref 22–29)
Calcium: 9.4 mg/dL (ref 8.5–10.5)
Chloride: 91 mEq/L — ABNORMAL LOW (ref 100–111)
Creatinine: 1.1 mg/dL (ref 0.7–1.3)
Globulin: 2.9 g/dL (ref 2.0–3.6)
Glucose: 524 mg/dL (ref 70–100)
Potassium: 4.8 mEq/L (ref 3.5–5.1)
Protein, Total: 6.8 g/dL (ref 6.0–8.3)
Sodium: 128 mEq/L — ABNORMAL LOW (ref 136–145)

## 2019-04-27 LAB — GLUCOSE WHOLE BLOOD - POCT
Whole Blood Glucose POCT: 473 mg/dL — ABNORMAL HIGH (ref 70–100)
Whole Blood Glucose POCT: 458 mg/dL — ABNORMAL HIGH (ref 70–100)

## 2019-04-27 LAB — GFR: EGFR: >60

## 2019-04-27 MED ORDER — INSULIN REGULAR 100 UNITS IN 100 ML NS (PREMIX)
0.1000 [IU]/kg/h | INTRAVENOUS | Status: DC
Start: 2019-04-27 — End: 2019-04-28

## 2019-04-27 MED ORDER — INSULIN SYRINGES (DISPOSABLE) U-100 1 ML MISC
0.1000 [IU]/kg | Freq: Once | Status: DC
Start: 2019-04-27 — End: 2019-04-28
  Filled 2019-04-27: qty 18

## 2019-04-27 MED ORDER — SODIUM CHLORIDE 0.9 % IV BOLUS
1000.0000 mL | Freq: Once | INTRAVENOUS | Status: AC
Start: 2019-04-27 — End: 2019-04-27
  Administered 2019-04-27: 1000 mL via INTRAVENOUS

## 2019-04-27 NOTE — ED Notes (Signed)
Triage Note    Initial evaluation to expedite care - not definitive treatment.  54 yo male with Ra, dm -  Today with fatigue - found to have hyperglycemia at urgent care with glucose 470's and sent to ED. Also found to have anemia ? Tonight.     Maurine Minister, MD  04/27/19 2022

## 2019-04-28 ENCOUNTER — Encounter: Payer: Self-pay | Admitting: Internal Medicine

## 2019-04-28 ENCOUNTER — Observation Stay: Payer: Medicaid Other

## 2019-04-28 DIAGNOSIS — Z794 Long term (current) use of insulin: Secondary | ICD-10-CM

## 2019-04-28 DIAGNOSIS — N390 Urinary tract infection, site not specified: Principal | ICD-10-CM

## 2019-04-28 DIAGNOSIS — E1065 Type 1 diabetes mellitus with hyperglycemia: Secondary | ICD-10-CM

## 2019-04-28 DIAGNOSIS — E1165 Type 2 diabetes mellitus with hyperglycemia: Secondary | ICD-10-CM

## 2019-04-28 DIAGNOSIS — R600 Localized edema: Secondary | ICD-10-CM

## 2019-04-28 DIAGNOSIS — R300 Dysuria: Secondary | ICD-10-CM

## 2019-04-28 DIAGNOSIS — R4182 Altered mental status, unspecified: Secondary | ICD-10-CM

## 2019-04-28 DIAGNOSIS — N209 Urinary calculus, unspecified: Secondary | ICD-10-CM

## 2019-04-28 DIAGNOSIS — E785 Hyperlipidemia, unspecified: Secondary | ICD-10-CM

## 2019-04-28 DIAGNOSIS — R739 Hyperglycemia, unspecified: Secondary | ICD-10-CM

## 2019-04-28 DIAGNOSIS — R634 Abnormal weight loss: Secondary | ICD-10-CM

## 2019-04-28 DIAGNOSIS — R531 Weakness: Secondary | ICD-10-CM

## 2019-04-28 LAB — LIPID PANEL
Cholesterol / HDL Ratio: 2.9
Cholesterol: 154 mg/dL (ref 0–199)
HDL: 53 mg/dL (ref 40–9999)
LDL Calculated: 82 mg/dL (ref 0–99)
Triglycerides: 94 mg/dL (ref 34–149)
VLDL Calculated: 19 mg/dL (ref 10–40)

## 2019-04-28 LAB — CBC AND DIFFERENTIAL
Absolute NRBC: 0 10*3/uL (ref 0.00–0.00)
Basophils Absolute Automated: 0.04 10*3/uL (ref 0.00–0.08)
Basophils Automated: 0.7 %
Eosinophils Absolute Automated: 0.04 10*3/uL (ref 0.00–0.44)
Eosinophils Automated: 0.7 %
Hematocrit: 34.1 % — ABNORMAL LOW (ref 37.6–49.6)
Hgb: 11.8 g/dL — ABNORMAL LOW (ref 12.5–17.1)
Immature Granulocytes Absolute: 0.01 10*3/uL (ref 0.00–0.07)
Immature Granulocytes: 0.2 %
Lymphocytes Absolute Automated: 1.33 10*3/uL (ref 0.42–3.22)
Lymphocytes Automated: 21.9 %
MCH: 27.4 pg (ref 25.1–33.5)
MCHC: 34.6 g/dL (ref 31.5–35.8)
MCV: 79.1 fL (ref 78.0–96.0)
MPV: 10 fL (ref 8.9–12.5)
Monocytes Absolute Automated: 0.61 10*3/uL (ref 0.21–0.85)
Monocytes: 10.1 %
Neutrophils Absolute: 4.03 10*3/uL (ref 1.10–6.33)
Neutrophils: 66.4 %
Nucleated RBC: 0 /100 WBC (ref 0.0–0.0)
Platelets: 363 10*3/uL — ABNORMAL HIGH (ref 142–346)
RBC: 4.31 10*6/uL (ref 4.20–5.90)
RDW: 13 % (ref 11–15)
WBC: 6.06 10*3/uL (ref 3.10–9.50)

## 2019-04-28 LAB — TSH: TSH: 0.91 u[IU]/mL (ref 0.35–4.94)

## 2019-04-28 LAB — RAPID DRUG SCREEN, URINE
Barbiturate Screen, UR: NEGATIVE
Benzodiazepine Screen, UR: NEGATIVE
Cannabinoid Screen, UR: NEGATIVE
Cocaine, UR: NEGATIVE
Opiate Screen, UR: POSITIVE — AB
PCP Screen, UR: NEGATIVE
Urine Amphetamine Screen: NEGATIVE

## 2019-04-28 LAB — BASIC METABOLIC PANEL
Anion Gap: 9 (ref 5.0–15.0)
BUN: 11 mg/dL (ref 9.0–28.0)
CO2: 27 mEq/L (ref 22–29)
Calcium: 9 mg/dL (ref 8.5–10.5)
Chloride: 99 mEq/L — ABNORMAL LOW (ref 100–111)
Creatinine: 0.8 mg/dL (ref 0.7–1.3)
Glucose: 220 mg/dL — ABNORMAL HIGH (ref 70–100)
Potassium: 4.1 mEq/L (ref 3.5–5.1)
Sodium: 135 mEq/L — ABNORMAL LOW (ref 136–145)

## 2019-04-28 LAB — GLUCOSE WHOLE BLOOD - POCT
Whole Blood Glucose POCT: 110 mg/dL — ABNORMAL HIGH (ref 70–100)
Whole Blood Glucose POCT: 144 mg/dL — ABNORMAL HIGH (ref 70–100)
Whole Blood Glucose POCT: 145 mg/dL — ABNORMAL HIGH (ref 70–100)
Whole Blood Glucose POCT: 205 mg/dL — ABNORMAL HIGH (ref 70–100)
Whole Blood Glucose POCT: 220 mg/dL — ABNORMAL HIGH (ref 70–100)
Whole Blood Glucose POCT: 242 mg/dL — ABNORMAL HIGH (ref 70–100)
Whole Blood Glucose POCT: 274 mg/dL — ABNORMAL HIGH (ref 70–100)
Whole Blood Glucose POCT: 307 mg/dL — ABNORMAL HIGH (ref 70–100)
Whole Blood Glucose POCT: 404 mg/dL — ABNORMAL HIGH (ref 70–100)
Whole Blood Glucose POCT: 410 mg/dL — ABNORMAL HIGH (ref 70–100)

## 2019-04-28 LAB — HEMOGLOBIN A1C: Hemoglobin A1C: >14 % — ABNORMAL HIGH (ref 4.6–5.9)

## 2019-04-28 LAB — MAGNESIUM: Magnesium: 1.6 mg/dL (ref 1.6–2.6)

## 2019-04-28 LAB — HEMOLYSIS INDEX: Hemolysis Index: 2 (ref 0–18)

## 2019-04-28 LAB — ETHANOL: Alcohol: NOT DETECTED mg/dL

## 2019-04-28 LAB — SEDIMENTATION RATE: Sed Rate: 19 mm/Hr — ABNORMAL HIGH (ref 0–15)

## 2019-04-28 LAB — C-REACTIVE PROTEIN: C-Reactive Protein: 0.4 mg/dL (ref 0.0–0.8)

## 2019-04-28 LAB — GFR: EGFR: >60

## 2019-04-28 MED ORDER — ENOXAPARIN SODIUM 40 MG/0.4ML SC SOLN
40.00 mg | Freq: Every day | SUBCUTANEOUS | Status: DC
Start: 2019-04-28 — End: 2019-04-29
  Administered 2019-04-28 – 2019-04-29 (×2): 40 mg via SUBCUTANEOUS
  Filled 2019-04-28 (×2): qty 0.4

## 2019-04-28 MED ORDER — GLUCAGON 1 MG IJ SOLR (WRAP)
1.00 mg | INTRAMUSCULAR | Status: DC | PRN
Start: 2019-04-28 — End: 2019-04-29

## 2019-04-28 MED ORDER — ATORVASTATIN CALCIUM 20 MG PO TABS
20.0000 mg | ORAL_TABLET | Freq: Every day | ORAL | Status: DC
Start: 2019-04-28 — End: 2019-04-29
  Administered 2019-04-28 – 2019-04-29 (×2): 20 mg via ORAL
  Filled 2019-04-28 (×3): qty 1

## 2019-04-28 MED ORDER — MAGNESIUM SULFATE IN D5W 1-5 GM/100ML-% IV SOLN
1.00 g | Freq: Once | INTRAVENOUS | Status: AC
Start: 2019-04-28 — End: 2019-04-28
  Administered 2019-04-28: 18:00:00 1 g via INTRAVENOUS
  Filled 2019-04-28: qty 100

## 2019-04-28 MED ORDER — LORAZEPAM 2 MG/ML IJ SOLN
1.00 mg | Freq: Once | INTRAMUSCULAR | Status: AC
Start: 2019-04-28 — End: 2019-04-28
  Administered 2019-04-28: 17:00:00 1 mg via INTRAVENOUS

## 2019-04-28 MED ORDER — CEFTRIAXONE SODIUM 1 G IJ SOLR
1.00 g | INTRAMUSCULAR | Status: DC
Start: 2019-04-28 — End: 2019-04-29
  Administered 2019-04-28 – 2019-04-29 (×2): 1 g via INTRAVENOUS
  Filled 2019-04-28 (×3): qty 1000

## 2019-04-28 MED ORDER — INSULIN GLARGINE 100 UNIT/ML SC SOLN
20.00 [IU] | Freq: Every morning | SUBCUTANEOUS | Status: DC
Start: 2019-04-28 — End: 2019-04-29
  Administered 2019-04-28 – 2019-04-29 (×2): 20 [IU] via SUBCUTANEOUS
  Filled 2019-04-28 (×3): qty 20

## 2019-04-28 MED ORDER — INSULIN LISPRO 100 UNIT/ML SC SOLN
2.00 [IU] | SUBCUTANEOUS | Status: DC
Start: 2019-04-28 — End: 2019-04-28
  Administered 2019-04-28: 02:00:00 6 [IU] via SUBCUTANEOUS
  Administered 2019-04-28: 05:00:00 4 [IU] via SUBCUTANEOUS
  Filled 2019-04-28: qty 12
  Filled 2019-04-28: qty 18

## 2019-04-28 MED ORDER — MORPHINE SULFATE 2 MG/ML IJ/IV SOLN (WRAP)
2.0000 mg | Freq: Once | Status: AC
Start: 2019-04-28 — End: 2019-04-28
  Administered 2019-04-28: 2 mg via INTRAVENOUS
  Filled 2019-04-28: qty 1

## 2019-04-28 MED ORDER — INSULIN LISPRO 100 UNIT/ML SC SOLN
1.00 [IU] | Freq: Three times a day (TID) | SUBCUTANEOUS | Status: DC
Start: 2019-04-28 — End: 2019-04-29
  Administered 2019-04-28 (×2): 3 [IU] via SUBCUTANEOUS
  Administered 2019-04-29: 13:00:00 5 [IU] via SUBCUTANEOUS
  Filled 2019-04-28: qty 15
  Filled 2019-04-28: qty 9

## 2019-04-28 MED ORDER — DROPERIDOL 2.5 MG/ML IJ SOLN
2.50 mg | Freq: Once | INTRAMUSCULAR | Status: AC
Start: 2019-04-28 — End: 2019-04-28
  Administered 2019-04-28: 18:00:00 2.5 mg via INTRAVENOUS
  Filled 2019-04-28: qty 2

## 2019-04-28 MED ORDER — ACETAMINOPHEN 325 MG PO TABS
650.0000 mg | ORAL_TABLET | Freq: Four times a day (QID) | ORAL | Status: DC | PRN
Start: 2019-04-28 — End: 2019-04-29
  Administered 2019-04-28: 650 mg via ORAL
  Filled 2019-04-28: qty 2

## 2019-04-28 MED ORDER — INSULIN SYRINGES (DISPOSABLE) U-100 1 ML MISC
0.1000 [IU]/kg | Freq: Once | Status: AC
Start: 2019-04-28 — End: 2019-04-28
  Administered 2019-04-28: 6 [IU] via INTRAVENOUS

## 2019-04-28 MED ORDER — DEXTROSE 50 % IV SOLN
12.50 g | INTRAVENOUS | Status: DC | PRN
Start: 2019-04-28 — End: 2019-04-29

## 2019-04-28 MED ORDER — INSULIN LISPRO 100 UNIT/ML SC SOLN
5.00 [IU] | Freq: Three times a day (TID) | SUBCUTANEOUS | Status: DC
Start: 2019-04-28 — End: 2019-04-29
  Administered 2019-04-28 – 2019-04-29 (×3): 5 [IU] via SUBCUTANEOUS
  Filled 2019-04-28 (×3): qty 15

## 2019-04-28 MED ORDER — ONDANSETRON HCL 4 MG/2ML IJ SOLN
4.0000 mg | Freq: Once | INTRAMUSCULAR | Status: AC
Start: 2019-04-28 — End: 2019-04-28
  Administered 2019-04-28: 4 mg via INTRAVENOUS
  Filled 2019-04-28: qty 2

## 2019-04-28 MED ORDER — INSULIN LISPRO 100 UNIT/ML SC SOLN
1.00 [IU] | Freq: Every evening | SUBCUTANEOUS | Status: DC
Start: 2019-04-28 — End: 2019-04-29

## 2019-04-28 MED ORDER — SODIUM CHLORIDE 0.9 % IV SOLN
INTRAVENOUS | Status: DC
Start: 2019-04-28 — End: 2019-04-29

## 2019-04-28 MED ORDER — GLUCOSE 40 % PO GEL
15.00 g | ORAL | Status: DC | PRN
Start: 2019-04-28 — End: 2019-04-29

## 2019-04-28 MED ORDER — KETOROLAC TROMETHAMINE 30 MG/ML IJ SOLN
30.00 mg | Freq: Once | INTRAMUSCULAR | Status: AC
Start: 2019-04-28 — End: 2019-04-28
  Administered 2019-04-28: 18:00:00 30 mg via INTRAVENOUS
  Filled 2019-04-28: qty 1

## 2019-04-28 NOTE — Consults (Signed)
STROKE ALERT NOTE    Date/Time: 04/28/19 6:18 PM  Patient Name: Rock Nephew  Attending Physician: Lucilla Edin, MD      Attestation:     The patient was seen and examined by me. History, chart review, and all CNS imaging was independently reviewed and confirmed by me. All pertinent parts of the neurological exam we performed and confirmed by me, with additional findings listed below by me. I agree with the assessment and plan as outlined by the mid-level provider as above, with the following modifications:      54 yo with PMH DM, HTN, HLD, recent significant weight loss, diffuse joint pain, initially admitted with DKA and generalized weakness. A stroke alert was called for increasing irritability and combative behavior. No focal neurologic deficit and pt has been irritable and with reported headache since arrival to the hospital. NIHSS 3 as pt refusing to comply with commands and orientation questions, and instead spewing profanities and being aggressive with care team.     NCHCT negative for acute process. No CTA was obtained but low suspicion.     Etiology of irritability is unclear but given pt's clinical presentation this is not consistent with stroke. No further work-up from stroke perspective.      STAT NCHCT for any acute neurologic change    This patient has a high probability of sudden clinically significant deterioration which requires the highest level of physician preparedness to intervene urgently. I managed/supervised life or organ supporting interventions that required frequent physician assessments. I devoted my full attention to the direct care of this patient for this period of time.      Rest as below     Maxwell Caul, MD  Vascular Neurology Attending     History of Presenting Illness:   Kade Mazziotti is a 54 yo with PMH DM, HTN, HLD, recent significant weight loss, diffuse joint pain, initially admitted with hyperglycemia and generalized weakness. A stroke alert was called at 3:58 for  headache, confusion. Per nursing he was not answering questions appropriately intermittently earlier in the day. Exact LKW time unclear.   On stroke team arrival the patient is combative, yelling, uncooperative with examination or questioning but moving all extremities strongly and purposefully. He has no aphasia or dysarthria.   He was taken for imaging, CT head showed no acute abnormalities. CTA head was unable to be obtained as the patient became increasingly agitated in the scanner. No tPA as there is low suspicion of acute stroke and he is non-focal on exam. Low suspicion for LVO as well.   NIHSS- 3  mRs-0    Presentation Data:     Presentation Data - 04/28/19 1608       Stroke Alert Activation (Date):  04/28/19     Stroke Team Response (Date):  04/28/19     Discovery of Stroke Symptoms (Time):  --     Discovery of Stroke Symptoms (Date):  04/28/19     Last Known Well - Time  --     Last Known Well - Date  --     mRS Assessment Source  Patient     mRS Value  0 - No symptoms     Patient Arrival Time (ED only)  --     Time of Discovery (inpatient only)  --     Initial Blood Glucose < 50 or >400  No           Initial Vital Signs:  BP:135/89 (04/27/19 2012)  Pulse:92 (04/27/19  2000)  Resp:17 (04/27/19 2012)  Temp:98.4 F (36.9 C) (04/27/19 2012)  SpO2:98 % (04/27/19 2000)    NIHSS Stroke Scale:     NIHSS - 04/28/19 1614       Interval  Change in Status     Level of Consciousness (1a. )  0     Question - 53, month  2     Commands; Open and close eyes; Grip and release good hand  1     Gaze  0     Visual Fields  0     Facial Palsy  0     Motor Left Arm:  Arms (palm down) x 10 seconds sitting = 90 degrees supine = 45 degress  0     Motor Right Arm:  Arms (palm down) x 10 seconds sitting = 90 degrees supine = 45 degress  0     Total Motor Arms  0     Motor Left Leg x 5 seconds supine = 30 degrees  0     Motor Right  Leg x 5 seconds supine = 30 degrees  0     Total Motor Legs  0     Limb Ataxia finger to nose  --      Sensory to pin prick  --     Best language describe picture, name items  0     Dysarthria reads words from list  0     Extinction and Inattention  --     NIHSS Total  --           CT:     CT Wet Reads - 04/28/19 1615          Non-Contrast CT Wet Read    Non-Contrast CT Wet Read  Yes     CT Read Time  1620     Non-Contrast CT Wet Read Findings  no acute findings        CTA/MRA  wet read    CTA/MRA  wet read  No CTA/MRA           IV Alteplase Administration:     IV thrombolytic Administration - 04/28/19 1811       IV thrombolytic administered?  No     Reason for no thrombolytic administration  --    Non-focal exam    Delay in Door to Treatment?  No           Recent tPA Administrations (last 72 hours)       None          Thrombectomy:     Thrombectomy - 04/28/19 1811       Endovascular thrombectomy indicated?   No     Reason for No Thrombectomy  --    Unlikely LVO as exam is non-focal          Allergies:   No Known Allergies    Medications:     Medications Prior to Admission   Medication Sig    atorvastatin (LIPITOR) 20 MG tablet Take 20 mg by mouth daily.    glimepiride (AMARYL) 2 MG tablet Take 2 mg by mouth every morning before breakfast.    metFORMIN (GLUCOPHAGE) 1000 MG tablet Take 1,000 mg by mouth 2 (two) times daily with meals.    ondansetron (ZOFRAN ODT) 4 MG disintegrating tablet Take 1 tablet (4 mg total) by mouth every 6 (six) hours as needed for Nausea.     Current Facility-Administered Medications   Medication Dose Route  Frequency    atorvastatin  20 mg Oral Daily    cefTRIAXone  1 g Intravenous Q24H    enoxaparin  40 mg Subcutaneous Daily    insulin glargine  20 Units Subcutaneous QAM AC    insulin lispro  1-4 Units Subcutaneous QHS    insulin lispro  1-8 Units Subcutaneous TID AC    insulin lispro  5 Units Subcutaneous TID AC    magnesium sulfate  1 g Intravenous Once       Past Medical History:     Past Medical History:   Diagnosis Date    Diabetes mellitus     High cholesterol      Hypertension     Kidney stone     x 15       Past Surgical History:     Past Surgical History:   Procedure Laterality Date    KNEE SURGERY      LITHOTRIPSY      TONSILLECTOMY      TONSILLECTOMY         Family History:     Family History   Problem Relation Age of Onset    Diabetes Mother     Atrial fibrillation Mother        Social History:     Social History     Socioeconomic History    Marital status: Single     Spouse name: Not on file    Number of children: Not on file    Years of education: Not on file    Highest education level: Not on file   Occupational History    Not on file   Tobacco Use    Smoking status: Never Smoker    Smokeless tobacco: Never Used   Substance and Sexual Activity    Alcohol use: No    Drug use: Never    Sexual activity: Not on file   Other Topics Concern    Not on file   Social History Narrative    Not on file     Social Determinants of Health     Financial Resource Strain:     Difficulty of Paying Living Expenses:    Food Insecurity:     Worried About Programme researcher, broadcasting/film/video in the Last Year:     Barista in the Last Year:    Transportation Needs:     Freight forwarder (Medical):     Lack of Transportation (Non-Medical):    Physical Activity:     Days of Exercise per Week:     Minutes of Exercise per Session:    Stress:     Feeling of Stress :    Social Connections:     Frequency of Communication with Friends and Family:     Frequency of Social Gatherings with Friends and Family:     Attends Religious Services:     Active Member of Clubs or Organizations:     Attends Engineer, structural:     Marital Status:    Intimate Partner Violence:     Fear of Current or Ex-Partner:     Emotionally Abused:     Physically Abused:     Sexually Abused:        Review of Systems:     Limited due to patient condition/combativeness   Endorses headache, appears confused, very agitated. Refuses to answer questions     Physical Exam:     Vital  Signs:  Reviewed  Visit  Vitals  BP 156/75   Pulse 94   Temp 97.3 F (36.3 C) (Oral)   Resp 20   Ht 1.778 m (5\' 10" )   Wt 62.6 kg (138 lb)   SpO2 96%   BMI 19.80 kg/m        General: The patient was well developed and well nourished. Uncooperative with exam.   -Head: normocephalic, atraumatic  -Respiratory: No obvious distress  -Extremities: No gross deformities. No edema. No cyanosis.  -Skin: intact. No rash    Mental Status: The patient is awake, alert and appears very agitated  Alertness: awake, alert.  Orientation: Disoriented/ will not respond  Attention  Very limited  Language: Patient does not respond to questions asked. Shouts clearly.  No dysarthria appreciated.   Speech: There is no evidence of aphasia in conversational speech.     Cranial nerves:   -CN II: Unable to carefully assess -Unable to visualize disc   -CN III, IV, VI: extraocular movements intact; no ptosis              -CN V: Exam limited  -CN VII: Face grossly symmetric   -CN VIII: Hearing intact to conversational speech   -CN IX, X: Clear phonation     Motor: Muscle tone normal without spasticity or flaccidity.  Strong, purposeful movement of all extremities                                        Sensory:   Exam limited by patient condition     Coordination: Rigors/ full body trembling    Gait: Deferred secondary to patient safety.     Assessment and Plan:   Tsugio Elison is a 54 yo with PMH DM, HTN, HLD, recent significant weight loss, diffuse joint pain, initially admitted with hyperglycemia and generalized weakness. A stroke alert was called at 3:58 for headache, confusion. Per nursing he was not answering questions appropriately intermittently earlier in the day. Exact LKW time unclear.   On stroke team arrival the patient is combative, yelling, uncooperative with examination or questioning but moving all extremities strongly and purposefully. He has no aphasia or dysarthria.    CTA head was unable to be obtained as the patient became  increasingly agitated in the scanner. No tpa, no MET    #encephalopathy  Etiology: unlikely acute CVA. Suspect toxic-metabolic or withdrawal.   -CT: No acute intracranial hemorrhage or mass effect identified.    Plan  -Maintain normotension  -BG control   -Continue atorvastatin  -Check and correct any metabolic/infectious abnormalities  -Consider CIWA  -No further stroke work up  -Stroke team to sign off    Tonia Ghent, MS, MHS  Physician Assistant  Stroke Neurology   Spectra [phone]    Tonia Ghent, [place]  Neurology - Stroke Team  Spectra [phone]

## 2019-04-28 NOTE — Plan of Care (Signed)
The patient seen and examined, he has complained of generalized body ache joint pain for the past 4 months.  He also reports that he lost his insurance and has not been taking any medication for his diabetes 4 months.  Inflammatory markers, CRP, ESR, Ana, physical therapy ordered.  Urinalysis positive for nitrate leukocyte esterase and WBC, culture results pending continue with Rocephin for now.  Endocrinology evaluation is requested for further assessment and recommendation.  The case discussed in detail with the patient while the bedside nurse was present in the room.

## 2019-04-28 NOTE — RRT Follow Up Note (Signed)
Follow up from Rapid Response activated earlier this afternoon for sudden onset of confusion, incoherent speech which led to a stroke alert being activated at that time as well. Patient is laying in bed, IV Fluids infusing per MD orders, calm and cooperative. Notes no complaints at this time. Primary RN reports no further concerns. Patient will be moving to 6th floor for better monitoring per team. Please call RRT at [phone] with any further concerns.

## 2019-04-28 NOTE — Progress Notes (Signed)
Ultrasound tech called stating patient verbalized he is not feeling good, started grabbing his head saying he does not feel good. Patient came back to unit ambulated back to bed, shaky and unsteady gait. FS obtained BG 205. Rapid response called by charge nurse and upgraded to stroke as patient unable to appropriately answer questions or follow simple commands. Rapid nurse came to bedside to assess patient and patient progressively got worse still unable to answer questions and unable to follow commands, started yelling "I do not know" "I do not have anything". Stroke team arrived, stat head CT ordered. Patient transported down to CT where he began to shake again, patient unable to lay flat screaming "it hurts", when asked where  Patient unable to identify where it hurts, began gagging and vomiting. When asked what is wrong patient responds inapropriately with " I don't know, I don't have anything." Patient had to be held down by rapid nurse during head CT. When asked to transfer from CT table back to bed patient started to get very verbally aggressive and unable to follow commands. 1mg  ativan given per Rapid provider. Patient calmed and transferred back to room. Migraine cocktail given. Patient calmly resting. Vital signs stable, patient still unable to answer simple questions appropriately. Report given to receiving nurse. Patient pending transport to [facility]

## 2019-04-28 NOTE — UM Notes (Signed)
Obs Status: 04/28/19 at 0012    04/28/19: 54 y.o. male w history of DM2, HLD, HTN who presents to [facility] ER with complaints of weakness, malaise, weight loss, and diffuse joint aches. Patient states he has been lost to follow with PCP since 2016 due to lack of insurance. He states for the last several months he has noticed decline in his physical condition. He notes 30-40lb weight loss since end of Dec 2020/ Jan 2021. He states he has been having diffuse joint aches and pains which have been debilitating to him. Patient notes his BG has been elevated for the last several months as well. Patient was concerned with his ongoing weight loss and states for the last several weeks he has been forcing himself to eat more to counteract his weight loss. He admits he has been eating and drinking unhealthy food to help with weight gain. This past week he was able to get insurance which is what resulted in his arrival initially to [facility]. He had xray of his shoulders done which were negative for fracture. Patient had preliminary blood work completed at [facility] which revealed BG in 500s. He was directed to the ER for further evaluation. Of note, patient was previously prescribed glimiperide and metformin for DM2 treatment. He has been off of all medication since Nov 2020.  In the ER, labs notable for Glu 524. Na 128. AG 10. Urine positive for ketones, leuk esterase, nitrite, and WBC. Patient given 6 units humulin. Given 1 L NS IVF bolus and admitted for further treatment and evaluation.   135/88, 99.0, 87, 20  SpO2 100%  H+h 12.2/35.9, plt 391, wbc ua 6-10, PT 9.9, glucose 524, na 128, cl 91, alk phos 151  NS IV, Regular insulin IV, Morphine IV, Zofran IV in the ER  Admit to CCW2 floor  Rocephin IV q day  NS 125 cc/h  Endocrinology consult  Consistent carbohydrate diet  Venous duplex doppler BLE  Tele monitoring  Plan per MD: Hyperglycemia  # Hx of DM2  - Glu 524 on admission, ketone positive in urine. AG 10  on admission  - given 1L IVF bolus in ER. 6 units humulin given in ER. Glu improved to 300s  - start high dose slididng scale insulin Q4hrs with accuchecks and hypoglycemia protocol  - consider endocrine consult in am   - consider dietician consult in am   - A1c pending  - f/u labs  Weight loss  # Generalized weakness  - etiology of weight loss is unclear. Likely multifactorial from poor diet, poorly controlled DM2 vs chronic pain vs other underlying disease  - patient's last colonoscopy was over 5 years ago  - TSH pending, occult stool pending  - consider outpatient rheumatologic workup  - fall precautions        Bobette Mo. Karel Jarvis, RN BSN  Utilization Review  [employer]  Tel: [phone]

## 2019-04-28 NOTE — Progress Notes (Signed)
Monitor fingerstick, continuous IV fluids. No signs of distress, tolerates ambulating. Complains of pain behind patient verbalized possibly from bright light- curtains in room adjusted, patient denies significant pain. Will continue to monitor.

## 2019-04-28 NOTE — Plan of Care (Signed)
Patient is a 54 year old male admitted with hyperglycemia. He has been oriented to room and unit. Scheduled medications given and well tolerated.   Problem: Safety  Goal: Patient will be free from injury during hospitalization  Outcome: Progressing  Flowsheets (Taken 04/28/2019 [phone])  Patient will be free from injury during hospitalization:   Assess patient's risk for falls and implement fall prevention plan of care per policy   Use appropriate transfer methods   Include patient/ family/ care giver in decisions related to safety   Provide alternative method of communication if needed (communication boards, writing)   Assess for patients risk for elopement and implement Elopement Risk Plan per policy   Hourly rounding   Ensure appropriate safety devices are available at the bedside   Provide and maintain safe environment  Goal: Patient will be free from infection during hospitalization  Outcome: Progressing  Flowsheets (Taken 04/28/2019 0611)  Free from Infection during hospitalization:   Assess and monitor for signs and symptoms of infection   Monitor all insertion sites (i.e. indwelling lines, tubes, urinary catheters, and drains)   Encourage patient and family to use good hand hygiene technique   Monitor lab/diagnostic results     Problem: Pain  Goal: Pain at adequate level as identified by patient  Outcome: Progressing  Flowsheets (Taken 04/28/2019 0611)  Pain at adequate level as identified by patient:   Identify patient comfort function goal   Assess for risk of opioid induced respiratory depression, including snoring/sleep apnea. Alert healthcare team of risk factors identified.   Assess pain on admission, during daily assessment and/or before any "as needed" intervention(s)   Reassess pain within 30-60 minutes of any procedure/intervention, per Pain Assessment, Intervention, Reassessment (AIR) Cycle   Evaluate if patient comfort function goal is met   Offer non-pharmacological pain management interventions    Consult/collaborate with Physical Therapy, Occupational Therapy, and/or Speech Therapy   Include patient/patient care companion in decisions related to pain management as needed   Evaluate patient's satisfaction with pain management progress   Consult/collaborate with Pain Service     Problem: Side Effects from Pain Analgesia  Goal: Patient will experience minimal side effects of analgesic therapy  Outcome: Progressing  Flowsheets (Taken 04/28/2019 0611)  Patient will experience minimal side effects of analgesic therapy:   Monitor/assess patient's respiratory status (RR depth, effort, breath sounds)   Prevent/manage side effects per LIP orders (i.e. nausea, vomiting, pruritus, constipation, urinary retention, etc.)   Evaluate for opioid-induced sedation with appropriate assessment tool (i.e. POSS)   Assess for changes in cognitive function     Problem: Discharge Barriers  Goal: Patient will be discharged home or other facility with appropriate resources  Outcome: Progressing  Flowsheets (Taken 04/28/2019 0611)  Discharge to home or other facility with appropriate resources:   Provide appropriate patient education   Provide information on available health resources   Initiate discharge planning     Problem: Psychosocial and Spiritual Needs  Goal: Demonstrates ability to cope with hospitalization/illness  Outcome: Progressing  Flowsheets (Taken 04/28/2019 0611)  Demonstrates ability to cope with hospitalizations/illness:   Encourage verbalization of feelings/concerns/expectations   Assist patient to identify own strengths and abilities   Encourage participation in diversional activity   Include patient/ patient care companion in decisions   Reinforce positive adaptation of new coping behaviors   Communicate referral to spiritual care as appropriate   Encourage patient to set small goals for self   Provide quiet environment

## 2019-04-28 NOTE — H&P (Addendum)
ADMISSION HISTORY AND PHYSICAL EXAM    Date Time: 04/28/19 4:30 AM  Patient Name: Ricky Johnson  Attending Physician: Lucilla Edin, MD  Primary Care Physician: Franne Grip, MD    CC: Hyperglycemia    Assessment & Plan     54 y.o. male w history of DM2, HLD, HTN admitted for hyperglycemia and generalized weakness     # Hyperglycemia  # Hx of DM2  - Glu 524 on admission, ketone positive in urine. AG 10 on admission  - given 1L IVF bolus in ER. 6 units humulin given in ER. Glu improved to 300s  - start high dose slididng scale insulin Q4hrs with accuchecks and hypoglycemia protocol  - consider endocrine consult in am   - consider dietician consult in am   - A1c pending  - f/u labs    # Weight loss  # Generalized weakness  - etiology of weight loss is unclear. Likely multifactorial from poor diet, poorly controlled DM2 vs chronic pain vs other underlying disease  - patient's last colonoscopy was over 5 years ago  - TSH pending, occult stool pending  - consider outpatient rheumatologic workup  - fall precautions    # Lower extremity edema  - patient reports L>R swelling   - bilateral lower extremity duplex pending    # Dysuria  # UTI  - UA with leuk esterase, nitrite positive, WBC present.   - Start empiric treatment with rocephin   - urine cultures pending    Chronic:   # HLD lipid panel pending. Resume home statin  # Nephrolithiasis. Passed 2 stones on monday    DVT ppx: Lovenox  Code Status: Full Code    Disposition:    Service status: Observation  Anticipated discharge needs: TBD       History of Presenting Illness:   Ricky Johnson is a 54 y.o. male w history of DM2, HLD, HTN who presents to [facility] ER with complaints of weakness, malaise, weight loss, and diffuse joint aches. Patient states he has been lost to follow with PCP since 2016 due to lack of insurance. He states for the last several months he has noticed decline in his physical condition. He notes 30-40lb weight loss since end of Dec 2020/ Jan  2021. He states he has been having diffuse joint aches and pains which have been debilitating to him. Patient notes his BG has been elevated for the last several months as well. Patient was concerned with his ongoing weight loss and states for the last several weeks he has been forcing himself to eat more to counteract his weight loss. He admits he has been eating and drinking unhealthy food to help with weight gain. This past week he was able to get insurance which is what resulted in his arrival initially to [facility]. He had xray of his shoulders done which were negative for fracture. Patient had preliminary blood work completed at [facility] which revealed BG in 500s. He was directed to the ER for further evaluation. Of note, patient was previously prescribed glimiperide and metformin for DM2 treatment. He has been off of all medication since Nov 2020.      In the ER, labs notable for Glu 524. Na 128. AG 10. Urine positive for ketones, leuk esterase, nitrite, and WBC. Patient given 6 units humulin. Given 1 L NS IVF bolus and admitted for further treatment and evaluation.     Review of systems: Denies fevers, chills, chest pain  or shortness of breath. Admits to 30-40lb weight loss in the last 4 months. Admits to lower extremity edema. Admits to generalized weakness and diffuse joint pain. All other systems were reviewed and are negative     Past Medical History:     Past Medical History:   Diagnosis Date    Diabetes mellitus     High cholesterol     Hypertension     Kidney stone     x 15       Past Surgical History:     Past Surgical History:   Procedure Laterality Date    KNEE SURGERY      LITHOTRIPSY      TONSILLECTOMY      TONSILLECTOMY         Family History:     Family History   Problem Relation Age of Onset    Diabetes Mother     Atrial fibrillation Mother        Social History:     Social History     Tobacco Use   Smoking Status Never Smoker   Smokeless Tobacco Never Used      Social History     Substance and Sexual Activity   Alcohol Use No     Social History     Substance and Sexual Activity   Drug Use Never       Allergies:   No Known Allergies    Medications:     Home Medications     Med List Status: Complete Set By: Merrie Roof, RN at 04/28/2019  2:38 AM                atorvastatin (LIPITOR) 20 MG tablet     Take 20 mg by mouth daily.     glimepiride (AMARYL) 2 MG tablet     Take 2 mg by mouth every morning before breakfast.     metFORMIN (GLUCOPHAGE) 1000 MG tablet     Take 1,000 mg by mouth 2 (two) times daily with meals.     ondansetron (ZOFRAN ODT) 4 MG disintegrating tablet     Take 1 tablet (4 mg total) by mouth every 6 (six) hours as needed for Nausea.          Physical Exam:     Patient Vitals for the past 24 hrs:   BP Temp Temp src Pulse Resp SpO2 Height Weight   04/28/19 0149 122/80 98.1 F (36.7 C) Oral 90 -- 98 % 1.778 m (5\' 10" ) 62.6 kg (138 lb)   04/28/19 0122 135/88 99 F (37.2 C) Oral 87 20 100 % -- --   04/28/19 0059 (!) 153/93 -- -- 86 -- 100 % -- --   04/27/19 2300 -- -- -- 83 21 99 % -- --   04/27/19 2230 -- -- -- 81 17 100 % -- --   04/27/19 2012 135/89 98.4 F (36.9 C) Oral -- 17 -- 1.778 m (5\' 10" ) 63 kg (139 lb)   04/27/19 2000 -- -- -- 92 -- 98 % -- --     Body mass index is 19.8 kg/m.  No intake or output data in the 24 hours ending 04/28/19 0430    General: awake, alert, oriented x 3; no acute distress, appears frail  HEENT: perrla, eomi, sclera anicteric, oropharynx clear without lesions, mucous membranes moist  Neck: supple, no lymphadenopathy, no JVD  Cardiovascular: regular rate and rhythm, no murmurs, rubs or gallops  Lungs: clear to auscultation bilaterally, without  wheezing, rhonchi, or rales  Abdomen: soft, non-tender, non-distended; no palpable masses, normoactive bowel sounds, no rebound or guarding   Extremities: non pitting edema noted of bilateral lower extremities.   Neuro: AAOx3, normal speech  Skin: no rashes or lesions  noted    Labs/Imaging:     Results     Procedure Component Value Units Date/Time    Glucose Whole Blood - POCT [[id number]]  (Abnormal) Collected: 04/28/19 0359     Updated: 04/28/19 0408     Whole Blood Glucose POCT 242 mg/dL     Glucose Whole Blood - POCT [[id number]]  (Abnormal) Collected: 04/28/19 0203     Updated: 04/28/19 0205     Whole Blood Glucose POCT 274 mg/dL     Glucose Whole Blood - POCT [[id number]]  (Abnormal) Collected: 04/28/19 0126     Updated: 04/28/19 0130     Whole Blood Glucose POCT 307 mg/dL     Glucose Whole Blood - POCT [[id number]]  (Abnormal) Collected: 04/27/19 2336     Updated: 04/28/19 0100     Whole Blood Glucose POCT 404 mg/dL     Glucose Whole Blood - POCT [[id number]]  (Abnormal) Collected: 04/28/19 0053     Updated: 04/28/19 0100     Whole Blood Glucose POCT 410 mg/dL     PT/APTT [[id number]]  (Abnormal) Collected: 04/27/19 2101     Updated: 04/27/19 2315     PT 9.9 sec      PT INR 0.9     PTT 29 sec     Narrative:      Replace urinary catheter prior to obtaining the urine culture  if it has been in place for greater than or equal to 14  days:->N/A No Foley  Indications for U/A Reflex to Micro - Reflex to  Culture:->Suprapubic Pain/Tenderness or Dysuria    GFR [[id number]] Collected: 04/27/19 2101     Updated: 04/27/19 2258     EGFR >60.0    Narrative:      Replace urinary catheter prior to obtaining the urine culture  if it has been in place for greater than or equal to 14  days:->N/A No Foley  Indications for U/A Reflex to Micro - Reflex to  Culture:->Suprapubic Pain/Tenderness or Dysuria    Comprehensive metabolic panel [[id number]]  (Abnormal) Collected: 04/27/19 2101    Specimen: Blood Updated: 04/27/19 2258     Glucose 524 mg/dL      BUN 88.4 mg/dL      Creatinine 1.1 mg/dL      Sodium 166 mEq/L      Potassium 4.8 mEq/L      Chloride 91 mEq/L      CO2 27 mEq/L      Calcium 9.4 mg/dL      Protein, Total 6.8 g/dL      Albumin 3.9 g/dL      AST (SGOT) 10 U/L      ALT 15 U/L       Alkaline Phosphatase 151 U/L      Bilirubin, Total 0.4 mg/dL      Globulin 2.9 g/dL      Albumin/Globulin Ratio 1.3     Anion Gap 10.0    Narrative:      Replace urinary catheter prior to obtaining the urine culture  if it has been in place for greater than or equal to 14  days:->N/A No Foley  Indications for U/A Reflex to Micro - Reflex to  Culture:->Suprapubic Pain/Tenderness or Dysuria    UA Reflex  to Micro - Reflex to Culture [[id number]]  (Abnormal) Collected: 04/27/19 2101    Specimen: Urine, Clean Catch Updated: 04/27/19 2229     Urine Type Urine, Clean Ca     Color, UA Straw     Clarity, UA Clear     Specific Gravity UA 1.032     Urine pH 6.0     Leukocyte Esterase, UA Trace     Nitrite, UA Positive     Protein, UR Negative     Glucose, UA >=500     Ketones UA 5     Urobilinogen, UA Normal mg/dL      Bilirubin, UA Negative     Blood, UA Negative     RBC, UA 0 - 2 /hpf      WBC, UA 6 - 10 /hpf     Narrative:      Replace urinary catheter prior to obtaining the urine culture  if it has been in place for greater than or equal to 14  days:->N/A No Foley  Indications for U/A Reflex to Micro - Reflex to  Culture:->Suprapubic Pain/Tenderness or Dysuria    Glucose Whole Blood - POCT [[id number]]  (Abnormal) Collected: 04/27/19 2216     Updated: 04/27/19 2223     Whole Blood Glucose POCT 458 mg/dL     CBC and differential [[id number]]  (Abnormal) Collected: 04/27/19 2101    Specimen: Blood Updated: 04/27/19 2218     WBC 6.41 x10 3/uL      Hgb 12.2 g/dL      Hematocrit 08.6 %      Platelets 391 x10 3/uL      RBC 4.52 x10 6/uL      MCV 79.4 fL      MCH 27.0 pg      MCHC 34.0 g/dL      RDW 13 %      MPV 9.8 fL      Neutrophils 69.7 %      Lymphocytes Automated 19.8 %      Monocytes 9.0 %      Eosinophils Automated 0.6 %      Basophils Automated 0.3 %      Immature Granulocytes 0.6 %      Nucleated RBC 0.0 /100 WBC      Neutrophils Absolute 4.46 x10 3/uL      Lymphocytes Absolute Automated 1.27 x10 3/uL      Monocytes  Absolute Automated 0.58 x10 3/uL      Eosinophils Absolute Automated 0.04 x10 3/uL      Basophils Absolute Automated 0.02 x10 3/uL      Immature Granulocytes Absolute 0.04 x10 3/uL      Absolute NRBC 0.00 x10 3/uL     Narrative:      Replace urinary catheter prior to obtaining the urine culture  if it has been in place for greater than or equal to 14  days:->N/A No Foley  Indications for U/A Reflex to Micro - Reflex to  Culture:->Suprapubic Pain/Tenderness or Dysuria    Type and Screen [[id number]] Collected: 04/27/19 2101    Specimen: Blood Updated: 04/27/19 2158     ABO Rh O POS     AB Screen Gel NEG    Glucose Whole Blood - POCT [[id number]]  (Abnormal) Collected: 04/27/19 2020     Updated: 04/27/19 2028     Whole Blood Glucose POCT 473 mg/dL           Imaging reviewed:  No results found.  Signed by: Juluis Rainier, DO  PI:[email or web address], Arley Phenix, MD

## 2019-04-28 NOTE — ED Provider Notes (Signed)
Attending Note:   The patient was seen and examined by the physicians assistant, and the plan of care was discussed with me. I agree with the plan as it was presented to me.   I have seen and evaluated this patient.  Patient presenting with generalized arthritis and hyperglycemia  He is hyperglycemic without DKA or HHNK.  Glucose improved with IVF and given SQ insulin       Harmon Dun, MD  04/29/19 1857

## 2019-04-28 NOTE — Plan of Care (Addendum)
Rapid Response Note    04/28/2019 4:36 PM  Called by care team regarding acute change in mental status.  Stroke call initiated.  Taken to CT but uncooperative.  Able to move all extremities with coherent speech but keeps repeating "it hurts" "god dammit".  Asked multiple times to clarify location of pain and at one point said "yes" to head.  Heart is tachy, Lungs clear, Abdomen benign, no rashes.  Required Ativan in CT suite due to acute agitation.    Expressive aphasia - SAH vs CVA vs Withdrawl    Labs reviewed, CT Head reviewed.  Will empirically treat for migraine with cocktail: Toradol + Droperidol + Magnesium.  Sitter ordered.  Paged primary attending to notify of events.    Wynona Canes, MD  Internal Medicine Resident  [facility]

## 2019-04-28 NOTE — ED Notes (Signed)
Bed: N 30  Expected date:   Expected time:   Means of arrival:   Comments:  Hold for Q2

## 2019-04-28 NOTE — ED Notes (Addendum)
..      [facility] EMERGENCY DEPT  ED NURSING NOTE FOR THE RECEIVING INPATIENT NURSE   ED NURSE [address]   [place] [address]   ED CHARGE RN [place] [phone]   ADMISSION INFORMATION   Ricky Johnson is a 54 y.o. male admitted with an ED diagnosis of:    1. Hyperglycemia         Isolation: None   Allergies: Patient has no known allergies.   Holding Orders confirmed? N/A   Belongings Documented? Yes   Home medications sent to pharmacy confirmed? N/A   NURSING CARE   Patient Comes From:   Mental Status: Home Independent  alert and oriented   ADL: Independent with all ADLs   Ambulation: mild difficulty d/t Rheumatoid arthritis pain    Pertinent Information  and Safety Concerns:    [facility] hyperglycemia. Hx DM2, RA, chronic Kidney stones. Pt does not take insulin or meds; states he has not seen a PCP/MD for 4 years. Given 6U IV insulin here.    CT / NIH   CT Head ordered on this patient?  N/A   NIH/Dysphagia assessment done prior to admission? N/A   VITAL SIGNS (at the time of this note)      Vitals:    04/28/19 0059   BP: (!) 153/93   Pulse: 86   Resp:    Temp:    SpO2: 100%

## 2019-04-28 NOTE — Consults (Signed)
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      A1C > 14    Risk factors-- very high BG with urine ketones and weight loss    Recommendations:   1. Lantus 20 units daily    2. Humalog 5/5/5 units plus sliding scale for meals and HS    3. Consider CT of the pancreas with and w/o contrast, as worsening diabetes with weight loss may be a sign of pancreatic malignancy  NO ORAL diabetes meds, as they will note likely work.    Target BG is 140-180    Patient will need follow up in transitional care clinic    Total time  of visit 80  min of which  45  minutes was spent at the bedside counseling patient of my findings  & coordination of care with the medical team  Issues discussed/addressed:   How to treat insulin-deficient diabetes  Lucilla Edin, MD, thank you for this consultation.  We will follow the patient with you during this hospitalization.  Please contact me with any questions or issues.          History of Presenting Illness:   Waris Rodger is a 54 y.o. male who presents to the hospital with severe weight loss and BG 524. See H and P for excellent note.  No known diabetes complications.    Onset diabetes ~ 2016, but he is not completely sure. He was tried on various oral diabetes meds, but none were effective.      Past Medical History:     Past Medical History:   Diagnosis Date    Diabetes mellitus     High cholesterol     Hypertension     Kidney stone     x 43       Available old records reviewed, including: all epic notes    Past Surgical History:     Past Surgical History:   Procedure Laterality Date    KNEE SURGERY      LITHOTRIPSY      TONSILLECTOMY      TONSILLECTOMY         Family  History:     Family History   Problem Relation Age of Onset    Diabetes Mother     Atrial fibrillation Mother        Social History:     Social History     Tobacco Use   Smoking Status Never Smoker   Smokeless Tobacco Never Used     Social History     Substance and Sexual Activity   Alcohol Use No     Social History     Substance and Sexual Activity   Drug Use Never       Allergies:   No Known Allergies    Medications:     Medications Prior to Admission   Medication Sig    atorvastatin (LIPITOR) 20 MG tablet Take 20 mg by mouth daily.    glimepiride (AMARYL) 2 MG tablet Take 2 mg by mouth every morning before breakfast.    metFORMIN (GLUCOPHAGE) 1000 MG tablet Take 1,000 mg by mouth 2 (two)  times daily with meals.    ondansetron (ZOFRAN ODT) 4 MG disintegrating tablet Take 1 tablet (4 mg total) by mouth every 6 (six) hours as needed for Nausea.       Current Facility-Administered Medications   Medication Dose Route Frequency    atorvastatin  20 mg Oral Daily    cefTRIAXone  1 g Intravenous Q24H    enoxaparin  40 mg Subcutaneous Daily    insulin glargine  20 Units Subcutaneous QAM AC    insulin lispro  1-4 Units Subcutaneous QHS    insulin lispro  1-8 Units Subcutaneous TID AC    insulin lispro  5 Units Subcutaneous TID AC        All medications reviewed    Review of Systems:   Comprehensive review of systems including constitutional, eyes, ears, nose, mouth, throat, cardiovascular, GI, GU, musculoskeletal, integumentary, respiratory, neurologic, psychiatric, and endocrine is negative other than what is mentioned already in the history of present illness.      Physical Exam:     Patient Vitals for the past 24 hrs:   BP Temp Temp src Pulse Resp SpO2 Height Weight   04/28/19 1113 (!) 158/95 97.3 F (36.3 C) Oral 88 -- 99 % -- --   04/28/19 0804 128/86 -- -- 98 -- 100 % -- --   04/28/19 0149 122/80 98.1 F (36.7 C) Oral 90 -- 98 % 1.778 m (5\' 10" ) 62.6 kg (138 lb)   04/28/19 0122 135/88 99 F (37.2 C)  Oral 87 20 100 % -- --   04/28/19 0059 (!) 153/93 -- -- 86 -- 100 % -- --   04/27/19 2300 -- -- -- 83 21 99 % -- --   04/27/19 2230 -- -- -- 81 17 100 % -- --   04/27/19 2012 135/89 98.4 F (36.9 C) Oral -- 17 -- 1.778 m (5\' 10" ) 63 kg (139 lb)   04/27/19 2000 -- -- -- 92 -- 98 % -- --     Today's vital signs reviewed.  Body mass index is 19.8 kg/m.  No intake or output data in the 24 hours ending 04/28/19 1345  Pt examined visually (NO touch exam):    General-- patient alert and oriented, in no distress.  Very thin    Lungs-- breathing is unlabored    Abd-- no distension    Extremities: no edema    Neuro-- patient moves all extremities, non-focal        Labs:     Recent Labs     04/28/19  0447 04/27/19  2101   WBC 6.06 6.41   Hgb 11.8* 12.2*   Hematocrit 34.1* 35.9*   Platelets 363* 391*       Recent Labs     04/28/19  0447 04/27/19  2101   Sodium 135* 128*   Potassium 4.1 4.8   Chloride 99* 91*   CO2 27 27   BUN 11.0 12.0   Creatinine 0.8 1.1   Glucose 220* 524*   Calcium 9.0 9.4   Magnesium 1.6  --        Recent Labs     04/27/19  2101   AST (SGOT) 10   ALT 15   Alkaline Phosphatase 151*   Protein, Total 6.8   Albumin 3.9       Recent Labs     04/27/19  2101   PTT 29   PT 9.9*   PT INR 0.9       All above  labs reviewed          Signed by: Elmer Sow, MD  Spectra [id number]    cc: Lucilla Edin, MD  Franne Grip, MD

## 2019-04-29 ENCOUNTER — Observation Stay: Payer: Medicaid Other

## 2019-04-29 ENCOUNTER — Other Ambulatory Visit: Payer: Self-pay

## 2019-04-29 ENCOUNTER — Inpatient Hospital Stay: Payer: Medicaid Other

## 2019-04-29 DIAGNOSIS — G9341 Metabolic encephalopathy: Secondary | ICD-10-CM

## 2019-04-29 LAB — GLUCOSE WHOLE BLOOD - POCT
Whole Blood Glucose POCT: 162 mg/dL — ABNORMAL HIGH (ref 70–100)
Whole Blood Glucose POCT: 281 mg/dL — ABNORMAL HIGH (ref 70–100)

## 2019-04-29 MED ORDER — OXYCODONE-ACETAMINOPHEN 5-325 MG PO TABS
1.0000 | ORAL_TABLET | Freq: Four times a day (QID) | ORAL | Status: DC | PRN
Start: 2019-04-29 — End: 2019-04-29
  Administered 2019-04-29: 1 via ORAL
  Filled 2019-04-29: qty 1

## 2019-04-29 MED ORDER — INSULIN NPH ISOPHANE & REGULAR (70-30) 100 UNIT/ML SC SUPN
15.0000 [IU] | PEN_INJECTOR | Freq: Two times a day (BID) | SUBCUTANEOUS | 0 refills | Status: DC
Start: 2019-04-29 — End: 2022-11-18

## 2019-04-29 MED ORDER — AMOXICILLIN-POT CLAVULANATE 875-125 MG PO TABS
1.00 | ORAL_TABLET | Freq: Two times a day (BID) | ORAL | 0 refills | Status: AC
Start: 2019-04-29 — End: 2019-05-04

## 2019-04-29 MED ORDER — BD PEN NEEDLE MICRO U/F 32G X 6 MM MISC
0 refills | Status: DC
Start: 2019-04-29 — End: 2019-04-29

## 2019-04-29 MED ORDER — INSULIN LISPRO (1 UNIT DIAL) 100 UNIT/ML SC SOPN
5.00 [IU] | PEN_INJECTOR | Freq: Three times a day (TID) | SUBCUTANEOUS | 0 refills | Status: DC
Start: 2019-04-29 — End: 2019-04-29

## 2019-04-29 MED ORDER — IOHEXOL 350 MG/ML IV SOLN
100.00 mL | Freq: Once | INTRAVENOUS | Status: AC | PRN
Start: 2019-04-29 — End: 2019-04-29
  Administered 2019-04-29: 100 mL via INTRAVENOUS

## 2019-04-29 MED ORDER — BD VEO INSULIN SYRINGE U/F 31G X 15/64" 0.3 ML MISC
1.0000 | Freq: Four times a day (QID) | 0 refills | Status: DC
Start: 2019-04-29 — End: 2022-11-18

## 2019-04-29 MED ORDER — LANTUS SOLOSTAR 100 UNIT/ML SC SOPN
20.00 [IU] | PEN_INJECTOR | Freq: Every morning | SUBCUTANEOUS | 0 refills | Status: DC
Start: 2019-04-29 — End: 2019-04-29

## 2019-04-29 MED ORDER — ATORVASTATIN CALCIUM 20 MG PO TABS
20.0000 mg | ORAL_TABLET | Freq: Every day | ORAL | 0 refills | Status: DC
Start: 2019-04-29 — End: 2022-11-18

## 2019-04-29 MED ORDER — CEFUROXIME AXETIL 500 MG PO TABS
500.0000 mg | ORAL_TABLET | Freq: Two times a day (BID) | ORAL | 0 refills | Status: DC
Start: 2019-04-29 — End: 2019-04-29

## 2019-04-29 NOTE — ED Provider Notes (Signed)
History     Chief Complaint   Patient presents with    Hyperglycemia    Generalized weakness     Pt w/o medical care for 4 years, non compliant with medication presents with generalized fatigue, mulitple joint swelling and pain for several weeks  Patient notes he got approved for health insurance today and was evaluated at urgent care.  Basic lab work showed elevated blood glucose; patient was advised to come to the emergency room for further assessment.  Patient's past medical history significant for diabetes, patient has not taken medication for the last 4 years.  Patient denies pain or difficulty breathing.    The history is provided by the patient. No language interpreter was used.   Hyperglycemia       Past Medical History:   Diagnosis Date    Diabetes mellitus     High cholesterol     Hypertension     Kidney stone     x 15       Past Surgical History:   Procedure Laterality Date    KNEE SURGERY      LITHOTRIPSY      TONSILLECTOMY      TONSILLECTOMY         Family History   Problem Relation Age of Onset    Diabetes Mother     Atrial fibrillation Mother        Social  Social History     Tobacco Use    Smoking status: Never Smoker    Smokeless tobacco: Never Used   Substance Use Topics    Alcohol use: No    Drug use: Never       .     No Known Allergies    Home Medications     Med List Status: Complete Set By: Merrie Roof, RN at 04/28/2019  2:38 AM                atorvastatin (LIPITOR) 20 MG tablet     Take 20 mg by mouth daily.     glimepiride (AMARYL) 2 MG tablet     Take 2 mg by mouth every morning before breakfast.     metFORMIN (GLUCOPHAGE) 1000 MG tablet     Take 1,000 mg by mouth 2 (two) times daily with meals.     ondansetron (ZOFRAN ODT) 4 MG disintegrating tablet     Take 1 tablet (4 mg total) by mouth every 6 (six) hours as needed for Nausea.           Review of Systems   All other systems reviewed and are negative.      Physical Exam    BP: 135/89, Heart Rate: 92, Temp: 98.4  F (36.9 C), Resp Rate: 17, SpO2: 98 %, Weight: 63 kg    Physical Exam  Vitals and nursing note reviewed.   Constitutional:       General: He is not in acute distress.     Appearance: He is well-developed. He is not diaphoretic.   HENT:      Head: Normocephalic and atraumatic.      Right Ear: External ear normal.      Left Ear: External ear normal.      Nose: Nose normal.   Eyes:      General:         Right eye: No discharge.         Left eye: No discharge.      Conjunctiva/sclera: Conjunctivae normal.  Pupils: Pupils are equal, round, and reactive to light.   Neck:      Trachea: Phonation normal. No tracheal deviation.   Cardiovascular:      Rate and Rhythm: Normal rate and regular rhythm.      Pulses:           Radial pulses are 2+ on the right side and 2+ on the left side.   Pulmonary:      Effort: Pulmonary effort is normal. No respiratory distress.   Musculoskeletal:         General: Normal range of motion.      Cervical back: Normal range of motion and neck supple.   Skin:     General: Skin is warm and dry.   Neurological:      Mental Status: He is alert and oriented to person, place, and time.      GCS: GCS eye subscore is 4. GCS verbal subscore is 5. GCS motor subscore is 6.      Cranial Nerves: No cranial nerve deficit.   Psychiatric:         Speech: Speech normal.           MDM and ED Course     ED Medication Orders (From admission, onward)    Start Ordered     Status Ordering Provider    04/28/19 0130 04/28/19 0013  insulin regular (HumuLIN R) 6 Units intravenous injection  Once     Route: Intravenous  Ordered Dose: 0.1 Units/kg     Last MAR action: Given Swara Donze    04/28/19 0130 04/28/19 0017  ondansetron (ZOFRAN) injection 4 mg  Once     Route: Intravenous  Ordered Dose: 4 mg     Last MAR action: Given Demerius Podolak    04/28/19 0100 04/28/19 0017  morphine injection 2 mg  Once     Route: Intravenous  Ordered Dose: 2 mg     Last MAR action: Given Grayling Schranz    04/28/19 0050 04/28/19 0049   0.9% NaCl infusion  Continuous     Route: Intravenous     Last MAR action: [facility], NADIA K    04/28/19 0018 04/28/19 0017    Every 4 hours scheduled     Route: Subcutaneous  Ordered Dose: 2-10 Units     Discontinued KHAN, NADIA K    04/28/19 0017 04/28/19 0017  dextrose (GLUCOSE) 40 % oral gel 15 g of glucose  As needed     Route: Oral  Ordered Dose: 15 g of glucose     Acknowledged KHAN, NADIA K    04/28/19 0017 04/28/19 0017  dextrose 50 % bolus 12.5 g  As needed     Route: Intravenous  Ordered Dose: 12.5 g     Acknowledged KHAN, NADIA K    04/28/19 0017 04/28/19 0017  glucagon (rDNA) (GLUCAGEN) injection 1 mg  As needed     Route: Intramuscular  Ordered Dose: 1 mg     Acknowledged KHAN, NADIA K    04/27/19 2313 04/27/19 2312    Once     Route: Intravenous  Ordered Dose: 0.1 Units/kg     Discontinued Jaivion Kingsley    04/27/19 2313 04/27/19 2312    Continuous     Route: Intravenous  Ordered Dose: 0.1 Units/kg/hr     Discontinued Tayleigh Wetherell    04/27/19 2022 04/27/19 2021  sodium chloride 0.9 % bolus 1,000 mL  Once     Route: Intravenous  Ordered Dose: 1,000 mL     Last MAR action: New Bag MECHANICK, JUDITH             MDM  Number of Diagnoses or Management Options     Amount and/or Complexity of Data Reviewed  Clinical lab tests: ordered and reviewed  Discuss the patient with other providers: yes    Risk of Complications, Morbidity, and/or Mortality  Presenting problems: moderate  Diagnostic procedures: moderate  Management options: moderate                 Reviewed all labs: Hyperglycemia without evidence of ketosis.  Hyponatremia 127 but corrected Na 134  Patient receiving IV fluids; insulin initiated.  Admitted to medicine service for further evaluation.    Procedures    Clinical Impression & Disposition     Clinical Impression  Final diagnoses:   Hyperglycemia        ED Disposition     ED Disposition Condition Date/Time Comment    Observation  Sat Apr 28, 2019 12:12 AM Admitting Physician: Juluis Rainier  [phone]   Service:: Medicine [106]   Estimated Length of Stay: < 2 midnights   Tentative Discharge Plan?: Home or Self Care [1]   Does patient need telemetry?: Yes   Telemetry type (separate Telemetry order is also required):: Medical telemetry             Current Discharge Medication List                    Tommi Rumps, [place]  04/29/19 0105

## 2019-04-29 NOTE — Plan of Care (Addendum)
Adult Observation Progress Note    Shift note: Patient received to shift at 0700. Bedside report received from outgoing RN.  Introduced self to patient and white board updated.  VSS and A&Ox2. No signs of distress at this time.  Pt rates pain 0/10. IV in place, saline locked. Fall level HIGH with fall precautions in place.      BM during shift:  Yes/No: No     Pending Orders:   neuro checks  Insulin sliding scale with accu checks  Endocrine consult  Dietician consult  Monitor lab results    Discharge Plan:  TBD    Social/Family Visits:  none    POC update: Patient updated on plan of care, verbalized understanding.       Vitals:    04/28/19 1113 04/28/19 1545 04/28/19 2004 04/29/19 0106   BP: (!) 158/95 156/75 147/77 130/82   Pulse: 88 94 (!) 101 93   Resp:    16   Temp: 97.3 F (36.3 C)      TempSrc: Oral      SpO2: 99% 96% 97% 98%   Weight:       Height:           Patient Lines/Drains/Airways Status      Active Lines, Drains and Airways       Name:   Placement date:   Placement time:   Site:   Days:    Peripheral IV 04/27/19 18 G Left Antecubital   04/27/19    2111    Antecubital   1           Inactive Lines, Drains and Airways       None                      Problem: Safety  Goal: Patient will be free from injury during hospitalization  Outcome: Progressing  Flowsheets (Taken 04/28/2019 6045 by Merrie Roof, RN)  Patient will be free from injury during hospitalization:   Assess patient's risk for falls and implement fall prevention plan of care per policy   Use appropriate transfer methods   Include patient/ family/ care giver in decisions related to safety   Provide alternative method of communication if needed (communication boards, writing)   Assess for patients risk for elopement and implement Elopement Risk Plan per policy   Hourly rounding   Ensure appropriate safety devices are available at the bedside   Provide and maintain safe environment  Goal: Patient will be free from infection during  hospitalization  Outcome: Progressing  Flowsheets (Taken 04/28/2019 0611 by Merrie Roof, RN)  Free from Infection during hospitalization:   Assess and monitor for signs and symptoms of infection   Monitor all insertion sites (i.e. indwelling lines, tubes, urinary catheters, and drains)   Encourage patient and family to use good hand hygiene technique   Monitor lab/diagnostic results     Problem: Pain  Goal: Pain at adequate level as identified by patient  Outcome: Progressing  Flowsheets (Taken 04/28/2019 0611 by Merrie Roof, RN)  Pain at adequate level as identified by patient:   Identify patient comfort function goal   Assess for risk of opioid induced respiratory depression, including snoring/sleep apnea. Alert healthcare team of risk factors identified.   Assess pain on admission, during daily assessment and/or before any "as needed" intervention(s)   Reassess pain within 30-60 minutes of any procedure/intervention, per Pain Assessment, Intervention, Reassessment (AIR) Cycle   Evaluate if  patient comfort function goal is met   Offer non-pharmacological pain management interventions   Consult/collaborate with Physical Therapy, Occupational Therapy, and/or Speech Therapy   Include patient/patient care companion in decisions related to pain management as needed   Evaluate patient's satisfaction with pain management progress   Consult/collaborate with Pain Service     Problem: Side Effects from Pain Analgesia  Goal: Patient will experience minimal side effects of analgesic therapy  Outcome: Progressing  Flowsheets (Taken 04/28/2019 0611 by Merrie Roof, RN)  Patient will experience minimal side effects of analgesic therapy:   Monitor/assess patient's respiratory status (RR depth, effort, breath sounds)   Prevent/manage side effects per LIP orders (i.e. nausea, vomiting, pruritus, constipation, urinary retention, etc.)   Evaluate for opioid-induced sedation with appropriate assessment tool  (i.e. POSS)   Assess for changes in cognitive function     Problem: Discharge Barriers  Goal: Patient will be discharged home or other facility with appropriate resources  Outcome: Progressing  Flowsheets (Taken 04/28/2019 0611 by Merrie Roof, RN)  Discharge to home or other facility with appropriate resources:   Provide appropriate patient education   Provide information on available health resources   Initiate discharge planning     Problem: Psychosocial and Spiritual Needs  Goal: Demonstrates ability to cope with hospitalization/illness  Outcome: Progressing  Flowsheets (Taken 04/28/2019 0611 by Merrie Roof, RN)  Demonstrates ability to cope with hospitalizations/illness:   Encourage verbalization of feelings/concerns/expectations   Assist patient to identify own strengths and abilities   Encourage participation in diversional activity   Include patient/ patient care companion in decisions   Reinforce positive adaptation of new coping behaviors   Communicate referral to spiritual care as appropriate   Encourage patient to set small goals for self   Provide quiet environment

## 2019-04-29 NOTE — Progress Notes (Addendum)
ADULT OBSERVATION UNIT  DISCHARGE NOTE      Date Time: 04/29/19 4:26 PM  Patient Name: Ricky Johnson  Attending Physician: Roxana Hires, MD      Date of Admission:   04/27/2019        Discharge Instructions:        Patient stable for discharge. Discharge instructions given about Diabetes, Nutrition, Insulin injection, patient teachings done and verbalized understanding. All questions answered at this time. Aware to follow-up and schedule appointments as necessary. IV access and cardiac monitor discontinued. Discharge to home.  Wheelchair  transport refused.    Patient aware to follow-up with MD and PMD as needed.  Patient was offered a month supply of original rx for insulin which totaled $134, [employer] spoke with patient but patient declined and agreed to have paper rx for Novolog 70/30 that Dr. Marlana Salvage prescribed.     Patient left by foot, steady gait, independent, with his belongings, appears in no distress. Jas Charge notified.     Signed by: Beaulah Dinning

## 2019-04-29 NOTE — PT Eval Note (Signed)
[facility]   Physical Therapy Evaluation   Patient: Ricky Johnson    MRN#: [id number]   Unit: [facility] TOWER 6  Bed: J191/Y782.95      Post Acute Care Therapy Recommendations:   Discharge Recommendations: Acute rehab     Patient anticipated to benefit from and to be able to engage in 3 hours of therapy a day for 5 days a week.     Milestones to be reached to achieve recommendation: improve mobility  Anticipate achievement in 2-3 sessions    DME Recommended for Discharge: No additional equipment/DME recommended at this time    If AR is unavailable patient will need increased assist at home, HHPT.     Therapy discharge recommendations may change with patient status.  Please refer to most recent note for up-to-date recommendations.    Assessment:   Significant Findings: none    Ricky Johnson is a 54 y.o. male admitted 04/27/2019.  Patient presents with increased pain, decreased functional strength and limitations with independent functional mobility; he reports that he has had increased difficulty with ambulation and mobility over the past few months due to increasing pain in joints. Today he presents overall requiring SBA with functional tasks limited by reported fatigue. Patient will benefit from continued skilled rehab to maximize functional gains and progress overall safety with mobility.    Impairments: Assessment: Decreased endurance/activity tolerance;Decreased functional mobility;Decreased balance;Gait impairment.     Therapy Diagnosis: Impaired mobility    Rehabilitation Potential: Prognosis: Good;With continued PT status post acute discharge    Treatment Activities: PT eval    Educated the patient to role of physical therapy, plan of care, and goals of therapy.    Plan:   Treatment/Interventions: Exercise, Gait training, Neuromuscular re-education, Functional transfer training, Equipment eval/education, Bed mobility, LE strengthening/ROM, Endurance training     PT  Frequency: 3-4x/wk   Risks/Benefits/POC Discussed with Pt/Family: With patient        Precautions and Contraindications:   Weight Bearing Status: no restrictions  Other Precautions: Falls  Interpreter used: n/a    Consult received for Ricky Johnson for PT Evaluation and Treatment.  Patients medical condition is appropriate for Physical therapy intervention at this time.    Medical Diagnosis: Hyperglycemia [R73.9]      History of Present Illness:   Ricky Johnson is a 54 y.o. male admitted on 04/27/2019 with weakness, malaise, joint aches, weight loss over a few months, s/p code stroke 4/10.    Past Medical/Surgical History:  Past Medical History:   Diagnosis Date    Diabetes mellitus     High cholesterol     Hypertension     Kidney stone     x 15     X-Rays/Tests/Labs:  US Venous Duplex Doppler Leg Bilateral    Result Date: 04/29/2019  No evidence of deep vein thrombosis involving either lower extremity. Ricky Burdock, MD  04/29/2019 8:27 AM    Social History:   Prior Level of Function:  Prior level of function: Independent with ADLs, Ambulates independently  Baseline Activity Level: Household ambulation  DME Currently at Home: (none)    Home Living Arrangements:  Living Arrangements: Alone  Type of Home: House  Home Layout: Two level, Bed/bath upstairs  DME Currently at Home: (none)  Home Living - Notes / Comments: Pt reports that he has been having increased difficulty ambulating over the past few months    Subjective: "I feel very tired standing"   Patient  is agreeable to participation in the therapy session. Nursing clears patient for therapy.     Patient Goal: to reduce pain    Pain Assessment  Pain Assessment: Wong-Baker FACES  Wong-Baker FACES Pain Rating: Hurts even more  Pain Location: Shoulder  Pain Intervention(s): Repositioned;Ambulation/increased activity    Objective:   Observation of Patient/Vital Signs:  Patient is supine in bed with tele monitoring, sitter in place.   Pt wore mask during  therapy session: Yes     Observation of Patient/Vital signs:  VSS    Cognition/Neuro Status  Arousal/Alertness: Appropriate responses to stimuli  Attention Span: Appears intact  Orientation Level: Oriented X4  Memory: Appears intact  Following Commands: Follows one step commands with repetition  Safety Awareness: minimal verbal instruction  Insights: Educated in safety awareness;Decreased awareness of deficits  Problem Solving: minimal assistance  Behavior: calm;cooperative  Motor Planning: intact  Coordination: GMC impaired    Musculoskeletal Examination:  RUE ROM: decr shoulder flex/ABD 2/2 pain  LUE ROM: decr shoulder mobility 2/2 pain  RLE ROM: WNL  LLE ROM: WNL    RUE strength: grossly 4-/5  LUE strength: grossly 4-/5  RLE strength: 4/5  LLE strength: 4/5    Tone: WNL    Numbness/tingling: reporting altered sensation in bilat hands but intact to LT throughout    Functional Mobility:  Supine to sit: Supervision  Sit to supine: Supervision  Sit to stand: SBA  Stand to sit: SBA  Transfers: CGA    Ambulation:  PMP - Progressive Mobility Protocol   PMP Activity: Step 6 - Walks in Room  Distance Walked (ft) (Step 6,7): 30 Feet   Gait pattern: shuffle pattern, decr step length   Assist level: CGA   Device: none     Balance:  Sitting balance: Good  Standing balance: Good    Participation and Activity Tolerance:  Participation: Fair  Activity tolerance: Fair    Patient left with call bell within reach, all needs met, SCDs off, fall mat down, bed alarm on, chair alarm n/a, all lines/tubes as found and all questions answered. RN notified of session outcome and patient response.     Goals:   Goals  Goal Formulation: With patient  Time for Goal Acheivement: 5 visits  Goals: Select goal  Pt Will Perform Sit to Stand: modified independent  Pt Will Transfer Bed/Chair: independent  Pt Will Ambulate: 151-200 feet, with rolling walker, modified independent  Pt Will Go Up / Down Stairs: 6-10 stairs, modified independent, With  rail     Therapy aide, n/a, present during session for assistance. PPE worn: n/a  PPE worn during session: Goggles, surgical mask, gloves    Time of treatment:   PT Received On: 04/29/19  Start Time: 1050  Stop Time: 1115  Time Calculation (min): 25 min      Duard Brady, PT, DPT  Pager number: [phone]

## 2019-04-29 NOTE — Progress Notes (Signed)
Educated patient insulin self injection via demonstration. Pt tolerated well.

## 2019-04-29 NOTE — Discharge Instr - AVS First Page (Addendum)
Reason for your Hospital Admission:  You were admitted with uncontrolled diabetes. 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 Only insulin will work to control your diabetes. We did a CT scan of your pancreas which did not show any tumor.     You urine sample shows that you have a bladder infection and we started antibiotics.     Your leg swelling was evaluated with an ultrasound which was negative for blood clots.       Instructions for after your discharge:  Follow-up with the [place] transitional discharge clinic for reevaluation. This may be a telemedicine or video visit.  Please call tomorrow for your appointment time if there is no appointment time listed on your discharge paperwork.  They will see you regardless of insurance.    Start the following insulin regimen: NPH/regular 70/30 insulin 15 units twice daily before meals. This can be purchased over-the-counter without a prescription. We advise going to [facility] where insulin is cheapest    Monitor/log your sugars before each meal. Review this log with the doctor at your next visit    You have been referred to the endocrinologist for further management of your diabetes    Complete a course of Augmentin for your bladder infection. A urine culture had not yet resulted at the time of discharge, the can be followed up when you see the doctor in the discharge clinic    You should have a echocardiogram of your heart at some point in the coming weeks to complete the evaluation of your leg swelling    You can go to Good[employer].com for free medication coupons

## 2019-04-29 NOTE — Discharge Summary (Addendum)
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    Urinary tract infection  Metabolic encephalopathy  Generalized weakness  Weight loss  LE edema  Hyperlipidemia      Disposition:  home     Discharge MEDICATIONS        Medication List      START taking these medications    BD Veo Insulin Syringe U/F 31G X 15/64" 0.3 ML Misc  Generic drug: Insulin Syringe-Needle U-100  1 each by Does not apply route 4 (four) times daily Use to inject insulin into the skin 4 times per day.     insulin NPH-regular 70-30 MIXTURE (70-30) 100 UNIT/ML injection  Commonly known as: HumuLIN 70/30  Inject 15 Units into the skin 2 (two) times daily before meals     amoxicillin-clavulanate 875-125 MG per tablet  Commonly known as: AUGMENTIN  Take 1 tablet by mouth 2 (two) times daily for 5 days     CONTINUE taking these medications    atorvastatin 20 MG tablet  Commonly known as: LIPITOR  Take 1 tablet (20 mg total) by mouth daily        STOP taking these medications    glimepiride 2 MG tablet  Commonly known as: AMARYL     metFORMIN 1000 MG tablet  Commonly known as: GLUCOPHAGE                 Where to Get Your Medications      You can get these medications from any pharmacy    Bring a paper prescription for each of these medications   amoxicillin-clavulanate 875-125 MG per tablet   atorvastatin 20 MG tablet   BD Veo Insulin Syringe U/F 31G X 15/64" 0.3 ML Misc   insulin NPH-regular 70-30 MIXTURE (70-30) 100 UNIT/ML injection       Pending Results, Recommendations & Instructions to providers after discharge:   1. Micro / Labs / Path pending:   Unresulted Labs     Procedure . . . Date/Time     ANA Screen [[id number]] Collected: 04/28/19 1116    Specimen: Blood Updated: 04/28/19 1311      2.   Consultations:   Treatment Team: Attending Provider: Roxana Hires, MD; Physician Assistant: Ricky Rumps, PA; Consulting Physician: Ricky Hires, MD; Technician: Ricky Johnson; Registered Nurse: Ricky Dinning, RN; Charge Nurse: Ricky Nest, RN; Charge Nurse: Ricky Lowenstein, RN   Recent Labs:   Recent Labs   Lab 04/28/19  0447   WBC 6.06   Hgb 11.8*   Hematocrit 34.1*   Platelets 363*     Recent Labs   Lab 04/28/19  0447   Sodium 135*   Potassium 4.1   Chloride 99*   CO2 27   BUN 11.0   Creatinine 0.8   EGFR >60.0   Glucose 220*   Calcium 9.0       Recent Labs   Lab 04/28/19  0447   TSH 0.91      No results found for: T4.       Recent Labs  Lab 04/28/19  0447   Cholesterol 154   Triglycerides 94   HDL 53   LDL Calculated 82             Microbiology Results     Procedure Component Value Units Date/Time    Adult Urine culture [[id number]] Collected: 04/27/19 2101    Specimen: Urine, Clean Catch Updated: 04/29/19 0925    Narrative:      Replace urinary catheter prior to obtaining the urine culture  if it has been in place for greater than or equal to 14  days:->N/A No Foley  Indications for Urine Culture:->Costovertebral Angle  Tenderness  ORDER#: [id number]                                    ORDERED BY: Johnson, Ricky  SOURCE: Urine, Clean Catch                           COLLECTED:  04/27/19 21:01  ANTIBIOTICS AT COLL.:                                RECEIVED :  04/28/19 08:30  Culture Urine                              PRELIM      04/29/19 09:25   +  04/29/19   >100,000 CFU/ML Gram positive cocci               Identification and susceptibility to follow            Procedures/Radiology performed:   CT Abdomen W WO IV/ WO PO Cont    Result Date: 04/29/2019   1. No pancreatic mass identified. 2. Bilateral nephrolithiasis. 3. Cholelithiasis. Ricky Lose, MD  04/29/2019 1:26 PM    US Venous Duplex Doppler Leg  Bilateral    Result Date: 04/29/2019  No evidence of deep vein thrombosis involving either lower extremity. Ricky Burdock, MD  04/29/2019 8:27 AM    ECHOCARDIOGRAM N/A  Hospital Course:   Reason for admission/ HPI: Ricky Johnson is a 54 y.o. male was admitted under observation for experiencing generalized weakness and joint aches.  Hospital Course: Ricky Johnson is a 55 y.o. male w history of DM2, HLD, HTN who presents to [facility] ER with complaints of weakness, malaise, weight loss, and diffuse joint aches. Patient states he has been lost to follow with PCP since 2016 due to lack of insurance. He states for the last several months he has noticed decline in his physical condition. He notes 30-40lb weight loss since end of Dec 2020/ Jan 2021. He states he has been having diffuse joint aches and pains which have been debilitating to him. He admits he has been eating and drinking unhealthy food to help with weight gain. This past week he was able to get insurance which is what resulted in his arrival initially to [facility]. He had xray of his shoulders done which were negative for fracture. Patient had preliminary blood work completed at [facility] which revealed BG in 500s. He was directed to the ER for further evaluation. Of note, patient was previously prescribed glimiperide and metformin for DM2 treatment. He has been off of all medication since Nov 2020.  Here at [facility] ER the patient was found to have elevated BS; labs notable for Glu 524. Na 128. AG 10. Urine positive for ketones, leuk esterase, nitrite, and WBC. Patient given 6 units humulin. Given 1 L NS IVF bolus and admitted for further treatment and evaluation. Pt's UTI was treated with IV antibiotics, UCX preliminary result shows gram positive cocci. 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 Pt was started on Lantus 20 units daily, and Humalog 5/5/5 units plus sliding scale for meals and HS. Also obtained CT of pancreas with and w/o contrast per Endocrinology as worsening diabetes with weight loss may be a sign of pancreatic malignancy. CT of Abdomen is negative for Pancreas malignancy and obstruction. Pt d/c's on 15 units BID of 70/30 insulin as he is currently uninsured.     While in house pt was also seen by Neurologist when a stroke alert was called for having headache and confusion. Pt did not demonstrate aphasia or dysarthria, CTH showed no acute abnormalities. Unable to obtain CTA of head as pt became increasingly agitated in the scanner. Pt was not a candidate for tPA as there was low suspicion of acute stroke and no further stroke work up was recommended. AMS resolved.     Currently the patient is hemodynamically stable for discharge with outpatient follow up as outlined below. Pt is advised to follow up with the [place] trasitional discharge clinic for reevaluation. This may be a telemedicine or video visit. Advised to monitor/log your sugars before each meal and review this log with the doctor at your next visit. UCx pending at discharge.     Discharge Day Exam:  DISCHARGE DAY EXAM:  BP 99/65    Pulse 94    Temp 98.8 F (37.1 C) (Oral)    Resp 16    Ht 1.778 m (5\' 10" )    Wt 62.6 kg (138 lb)    SpO2 96%    BMI 19.80 kg/m   Body mass index is 19.8 kg/m.  General: awake, alert, oriented x 3; no acute distress.  Cardiovascular: regular rate and rhythm, no murmurs, rubs or gallops  Lungs: clear to auscultation bilaterally, without wheezing, rhonchi, or rales  Abdomen: soft, non-tender, non-distended; no palpable masses, no hepatosplenomegaly, normoactive bowel sounds, no rebound or guarding  Extremities: no clubbing, cyanosis, or edema    Discharge Instructions:   Diet:: Consistent Carbohydrates  Activity: as tolerated  Follow-up Information     [facility] Follow up.    Why: Please make sure you attend your appointment with [facility] clinic.  Contact information:  [address], [address]  [place] [address]  [phone]           [facility] .    Specialty: Internal Medicine  Contact information:  [address]  [place] [address]  [phone]  Additional information:  From [place], take [place] [address] for approximately 38.9 miles. Turn right onto [address] for 0.1 miles and then turn right onto [address]. Destination is on your left.         Enter [place] at the [employer] and register at registration.                  Signed by: Ivor Messier, DNP,FNP-BC  [facility]   ADULT OBSERVATION UNIT [phone] F: [phone]  CC: Ricky Grip, MD    Attending Attestation:   I have reviewed the interval history, images, and pertinent test results.  I have personally examined the patient and confirmed the major physical findings of the preceding discharge note by Ivor Messier, NP.  I agree with the preceding note with minor changes made above.    Ricky Hires, MD

## 2019-04-29 NOTE — UM Notes (Signed)
STATUS UPGRADED TO INPATIENT  04/29/19 0958  Admit to Inpatient Once         FROM OBSERVATION  04/28/19 0012  Adult Admit to Observation Once         Initial IP Review for 04/29/2019    A 54 yo with PMH DM, HTN, HLD, recent significant weight loss, diffuse joint pain, initially admitted on 4/10 on obs status for hyperglycemia and generalized weakness. A stroke alert was called yesterday at 3:58 pm for headache,  confusion. Per nursing he was not answering questions  appropriately intermittently earlier in the day. Exact LKW time unclear. On stroke team arrival the patient is combative, yelling, uncooperative with examination or questioning but moving all extremities strongly and purposefully. He has no aphasia or dysarthria. He was taken for imaging, CT head showed no acute abnormalities. CTA head was unable to be obtained as the patient became increasingly agitated in the scanner. No tPA as there is low suspicion of acute stroke and he is non-focal on exam. Low suspicion for LVO as well. NIHSS- 3  mRs-0    Past Medical History:   Diagnosis Date    Diabetes mellitus     High cholesterol     Hypertension     Kidney stone     x 15     Past Surgical History:   Procedure Laterality Date    KNEE SURGERY      LITHOTRIPSY      TONSILLECTOMY      TONSILLECTOMY       Medications Prior to Admission   Medication Sig    atorvastatin (LIPITOR) 20 MG tablet Take 20 mg by mouth daily.    glimepiride (AMARYL) 2 MG tablet Take 2 mg by mouth every morning before breakfast.    metFORMIN (GLUCOPHAGE) 1000 MG tablet Take 1,000 mg by mouth 2 (two) times daily with meals.    ondansetron (ZOFRAN ODT) 4 MG disintegrating tablet Take 1 tablet (4 mg total) by mouth every 6 (six) hours as needed for Nausea.     VS--T 99.1  P 86  BP 150/87  R 16  SpO2 97%  BMI 19.80 kg/m2    US VENOUS DUPLEX DOPPLER--No evidence of deep vein thrombosis involving either lower extremity.    LABS--H/H 11.8 / 34.1  RBC 3.63  Glu 220  HgA1C  >14.0  NA 135  CL 99  Sed rate 19  Opiate Screen Positive    Current Facility-Administered Medications   Medication Dose Route Frequency    atorvastatin  20 mg Oral Daily    cefTRIAXone  1 g Intravenous Q24H    enoxaparin  40 mg Subcutaneous Daily    insulin glargine  20 Units Subcutaneous QAM AC    insulin lispro  1-4 Units Subcutaneous QHS    insulin lispro  1-8 Units Subcutaneous TID AC    insulin lispro  5 Units Subcutaneous TID AC    magnesium sulfate  1 g Intravenous Once     PLAN:  Upgrade to inpatient status  -Maintain normotension  -BG control   -Continue atorvastatin  -Check and correct any metabolic/infectious abnormalities  -Consider CIWA  -No further stroke work up    [employer] [facility] BSN  Utilization Review  [employer]  [phone]

## 2019-04-29 NOTE — Plan of Care (Signed)
Adult Observation Progress Note    Shift Note:    General: Patient VSS, in no apparent distress at this time. C/O generalized joint pain-relieved by Percocet 1tab PO per pt.  Neuro: Patient is AOx4. Patient denies numbness or tingling. Perrla.   Cardio: SR on telemetry, verified with [facility]. S1, S2 auscultated. BLE edema +1 noted, MD aware.  Resp: Patient on roomair with lung sounds clear bilaterally on auscultation.   Integ: Skin intact, no wounds or edema noted.   MSK: Patient ambulates: independently with: standby assist. low/mod/high: Low falls risk.   GI: Bowel sounds auscultated all 4 quadrants.   GU: Patient is continent of urine.   IV Access: IV: 18g  in: LAC. SL, [place] IVF per Dr. Marlana Salvage.    No significant events or provider communication. Patient 0 pain at this time, and is resting comfortably in bed.    BM during shift: YES/NO: no LBM 04/27/19 refused intervention per pt.    Pending Orders:   CT Pancreas  PO Abx when [place]  Blood Sugar Management  Endo Consult    Discharge Plan: To TBD, pending medical clearance    Social/Family Visits:   N/A    POC update: pt updated with POC during bedside report      Vitals:    04/29/19 0106 04/29/19 0534 04/29/19 0702 04/29/19 1110   BP: 130/82 150/87 149/86 99/65   Pulse: 93 88 86 94   Resp: 16 18 16 16    Temp:  97.7 F (36.5 C) 99.1 F (37.3 C) 98.8 F (37.1 C)   TempSrc:  Axillary Oral Oral   SpO2: 98% 97% 97% 96%   Weight:       Height:           Patient Lines/Drains/Airways Status    Active Lines, Drains and Airways     Name:   Placement date:   Placement time:   Site:   Days:    Peripheral IV 04/27/19 18 G Left Antecubital   04/27/19    2111    Antecubital   1                Problem: Safety  Goal: Patient will be free from injury during hospitalization  Outcome: Progressing  Flowsheets (Taken 04/28/2019 1610 by Merrie Roof, RN)  Patient will be free from injury during hospitalization:   Assess patient's risk for falls and implement fall prevention plan of care per  policy   Use appropriate transfer methods   Include patient/ family/ care giver in decisions related to safety   Provide alternative method of communication if needed (communication boards, writing)   Assess for patients risk for elopement and implement Elopement Risk Plan per policy   Hourly rounding   Ensure appropriate safety devices are available at the bedside   Provide and maintain safe environment  Goal: Patient will be free from infection during hospitalization  Outcome: Progressing  Flowsheets (Taken 04/28/2019 0611 by Merrie Roof, RN)  Free from Infection during hospitalization:   Assess and monitor for signs and symptoms of infection   Monitor all insertion sites (i.e. indwelling lines, tubes, urinary catheters, and drains)   Encourage patient and family to use good hand hygiene technique   Monitor lab/diagnostic results     Problem: Pain  Goal: Pain at adequate level as identified by patient  Outcome: Progressing  Flowsheets (Taken 04/28/2019 0611 by Merrie Roof, RN)  Pain at adequate level as identified by patient:  Identify patient comfort function goal   Assess for risk of opioid induced respiratory depression, including snoring/sleep apnea. Alert healthcare team of risk factors identified.   Assess pain on admission, during daily assessment and/or before any "as needed" intervention(s)   Reassess pain within 30-60 minutes of any procedure/intervention, per Pain Assessment, Intervention, Reassessment (AIR) Cycle   Evaluate if patient comfort function goal is met   Offer non-pharmacological pain management interventions   Consult/collaborate with Physical Therapy, Occupational Therapy, and/or Speech Therapy   Include patient/patient care companion in decisions related to pain management as needed   Evaluate patient's satisfaction with pain management progress   Consult/collaborate with Pain Service     Problem: Side Effects from Pain Analgesia  Goal: Patient will  experience minimal side effects of analgesic therapy  Outcome: Progressing  Flowsheets (Taken 04/28/2019 0611 by Merrie Roof, RN)  Patient will experience minimal side effects of analgesic therapy:   Monitor/assess patient's respiratory status (RR depth, effort, breath sounds)   Prevent/manage side effects per LIP orders (i.e. nausea, vomiting, pruritus, constipation, urinary retention, etc.)   Evaluate for opioid-induced sedation with appropriate assessment tool (i.e. POSS)   Assess for changes in cognitive function     Problem: Discharge Barriers  Goal: Patient will be discharged home or other facility with appropriate resources  Outcome: Progressing  Flowsheets (Taken 04/28/2019 0611 by Merrie Roof, RN)  Discharge to home or other facility with appropriate resources:   Provide appropriate patient education   Provide information on available health resources   Initiate discharge planning     Problem: Psychosocial and Spiritual Needs  Goal: Demonstrates ability to cope with hospitalization/illness  Outcome: Progressing  Flowsheets (Taken 04/28/2019 0611 by Merrie Roof, RN)  Demonstrates ability to cope with hospitalizations/illness:   Encourage verbalization of feelings/concerns/expectations   Assist patient to identify own strengths and abilities   Encourage participation in diversional activity   Include patient/ patient care companion in decisions   Reinforce positive adaptation of new coping behaviors   Communicate referral to spiritual care as appropriate   Encourage patient to set small goals for self   Provide quiet environment

## 2019-04-30 ENCOUNTER — Telehealth (INDEPENDENT_AMBULATORY_CARE_PROVIDER_SITE_OTHER): Payer: Self-pay

## 2019-04-30 LAB — ANA SCREEN ONLY: ANA Screen: NEGATIVE

## 2019-04-30 NOTE — Telephone Encounter (Signed)
[facility]    Received a referral to schedule a follow up appointment with the [facility].  Left patient a voicemail and provided main clinic phone number.  Requested patient return call to schedule a follow up appointment as soon as possible.       Ingrid Benitez  Transitional Services   Sched Reg Rep II  T [phone]  F [phone]

## 2019-05-06 NOTE — Retrospective Coding Query (Signed)
PHYSICIAN'S DOCUMENTATION                                                                      REQUEST                                                                         Date of Request:  05/06/2019  Type of Request:  DOCUMENTATION CLARIFICATION                                         Patient Name: Ricky Johnson, Ricky Johnson  Account #: [id number]  MR #: [id number]  Discharge Date: 04/29/2019      Dear Dr. Marlana Salvage,    The medical record reflects the following:    54 yo with PMHDM,HTN, HLD, recentsignificantweight loss, diffuse joint pain,initially admitted with hyperglycemiaand generalized weakness. A stroke alert was called at 3:58 on 4/11 for headache, confusion. Per nursing he was not answering questions appropriatelyintermittentlyearlier in the day.Exact LKW time unclear.  On stroke team arrival the patient is combative,yelling,uncooperativewith examination or questioning but moving all extremities strongly and purposefully. He has no aphasia or dysarthria.   He was taken for imaging, CT head showed no acute abnormalities. CTA head was unable to be obtained as the patient became increasingly agitated in the scanner. No tPA as there is low suspicion of acute stroke and he is non-focal on exam. Low suspicion for LVO as well.   NIHSS- 3.      Neurology 04/29/19:   Encephalopathy - etiology: unlikely acute CVA. Suspect toxic-metabolic or withdrawal.   -CT:No acute intracranial hemorrhage or mass effect identified.    Inpatient order written on 04/29/19    Discharge Summary/Diagnosis(es):     Hyperglycemia and Weakness  No etiology for patient's confusion documented          Request to Physician:    Please clarify the etiology of the patient's presenting symptom(s) of headache/confusion/encephalopathy...    Important Note:  In the inpatient setting, you may document "Possible" "Probable" diagnoses that you have not ruled out at point of discharge. Any  conditions ruled out during the hospital stay will not be coded. Documentation should include the medical decision making process and other clinical information supporting the suspected condition.      Physician Response:    Metabolic encephalopathy        Thank you,  Farley Ly, Coder IV  [email or web address]  If you need assistance or have questions; please call [phone]             Date 05/06/2019

## 2022-11-09 ENCOUNTER — Emergency Department: Payer: No Typology Code available for payment source

## 2022-11-09 ENCOUNTER — Emergency Department
Admission: EM | Admit: 2022-11-09 | Discharge: 2022-11-09 | Disposition: A | Discharge status: Home / Self Care | Payer: No Typology Code available for payment source | Attending: Emergency Medicine | Admitting: Emergency Medicine

## 2022-11-09 DIAGNOSIS — N200 Calculus of kidney: Secondary | ICD-10-CM | POA: Insufficient documentation

## 2022-11-09 LAB — COMPREHENSIVE METABOLIC PANEL
ALT: 23 U/L (ref 0–55)
AST (SGOT): 20 U/L (ref 5–41)
Albumin/Globulin Ratio: 1.3 (ref 0.9–2.2)
Albumin: 3.9 g/dL (ref 3.5–5.0)
Alkaline Phosphatase: 110 U/L (ref 37–117)
Anion Gap: 10 (ref 5.0–15.0)
BUN: 21 mg/dL (ref 9–28)
Bilirubin, Total: 0.7 mg/dL (ref 0.2–1.2)
CO2: 23 meq/L (ref 17–29)
Calcium: 9.7 mg/dL (ref 8.5–10.5)
Chloride: 105 meq/L (ref 99–111)
Creatinine: 1 mg/dL (ref 0.5–1.5)
GFR: >60 mL/min/{1.73_m2} (ref >=60.0)
Globulin: 2.9 g/dL (ref 2.0–3.6)
Glucose: 162 mg/dL — ABNORMAL HIGH (ref 70–100)
Potassium: 4.8 meq/L (ref 3.5–5.3)
Protein, Total: 6.8 g/dL (ref 6.0–8.3)
Sodium: 138 meq/L (ref 135–145)

## 2022-11-09 LAB — LAB USE ONLY - CBC WITH DIFFERENTIAL
Absolute Basophils: 0.05 10*3/uL (ref 0.00–0.08)
Absolute Eosinophils: 0.1 10*3/uL (ref 0.00–0.44)
Absolute Immature Granulocytes: 0.02 10*3/uL (ref 0.00–0.07)
Absolute Lymphocytes: 1.46 10*3/uL (ref 0.42–3.22)
Absolute Monocytes: 0.54 10*3/uL (ref 0.21–0.85)
Absolute Neutrophils: 4.43 10*3/uL (ref 1.10–6.33)
Absolute nRBC: 0 10*3/uL (ref <=0.00)
Basophils %: 0.8 %
Eosinophils %: 1.5 %
Hematocrit: 36.1 % — ABNORMAL LOW (ref 37.6–49.6)
Hemoglobin: 12.7 g/dL (ref 12.5–17.1)
Immature Granulocytes %: 0.3 %
Lymphocytes %: 22.1 %
MCH: 30 pg (ref 25.1–33.5)
MCHC: 35.2 g/dL (ref 31.5–35.8)
MCV: 85.3 fL (ref 78.0–96.0)
MPV: 10.6 fL (ref 8.9–12.5)
Monocytes %: 8.2 %
Neutrophils %: 67.1 %
Platelet Count: 299 10*3/uL (ref 142–346)
Preliminary Absolute Neutrophil Count: 4.43 10*3/uL (ref 1.10–6.33)
RBC: 4.23 10*6/uL (ref 4.20–5.90)
RDW: 13 % (ref 11–15)
WBC: 6.6 10*3/uL (ref 3.10–9.50)
nRBC %: 0 /100{WBCs} (ref <=0.0)

## 2022-11-09 LAB — URINALYSIS WITH REFLEX TO MICROSCOPIC EXAM - REFLEX TO CULTURE
Urine Bilirubin: NEGATIVE
Urine Glucose: NEGATIVE
Urine Ketones: NEGATIVE mg/dL
Urine Leukocyte Esterase: NEGATIVE
Urine Nitrite: NEGATIVE
Urine Specific Gravity: 1.027 (ref 1.001–1.035)
Urine Urobilinogen: NORMAL mg/dL (ref 0.2–2.0)
Urine pH: 6 (ref 5.0–8.0)

## 2022-11-09 LAB — LAB USE ONLY - URINE GRAY CULTURE HOLD TUBE

## 2022-11-09 MED ORDER — ONDANSETRON HCL 4 MG/2ML IJ SOLN
4.0000 mg | Freq: Once | INTRAMUSCULAR | Status: AC
Start: 2022-11-09 — End: 2022-11-09
  Administered 2022-11-09: 4 mg via INTRAVENOUS
  Filled 2022-11-09: qty 2

## 2022-11-09 MED ORDER — ONDANSETRON 4 MG PO TBDP
4.0000 mg | ORAL_TABLET | Freq: Four times a day (QID) | ORAL | 0 refills | Status: DC | PRN
Start: 2022-11-09 — End: 2023-03-09

## 2022-11-09 MED ORDER — TAMSULOSIN HCL 0.4 MG PO CAPS
0.4000 mg | ORAL_CAPSULE | Freq: Every day | ORAL | 0 refills | Status: DC
Start: 2022-11-09 — End: 2022-11-19

## 2022-11-09 MED ORDER — OXYCODONE-ACETAMINOPHEN 5-325 MG PO TABS
1.0000 | ORAL_TABLET | Freq: Once | ORAL | Status: AC
Start: 2022-11-09 — End: 2022-11-09
  Administered 2022-11-09: 1 via ORAL
  Filled 2022-11-09: qty 1

## 2022-11-09 MED ORDER — IBUPROFEN 800 MG PO TABS
800.0000 mg | ORAL_TABLET | Freq: Three times a day (TID) | ORAL | 0 refills | Status: DC | PRN
Start: 2022-11-09 — End: 2022-11-19

## 2022-11-09 MED ORDER — OXYCODONE-ACETAMINOPHEN 5-325 MG PO TABS
1.0000 | ORAL_TABLET | ORAL | 0 refills | Status: AC | PRN
Start: 2022-11-09 — End: 2022-11-16

## 2022-11-09 MED ORDER — MORPHINE SULFATE 2 MG/ML IJ/IV SOLN (WRAP)
4.0000 mg | Freq: Once | Status: AC
Start: 2022-11-09 — End: 2022-11-09
  Administered 2022-11-09: 4 mg via INTRAVENOUS
  Filled 2022-11-09: qty 2

## 2022-11-09 MED ORDER — KETOROLAC TROMETHAMINE 30 MG/ML IJ SOLN
15.0000 mg | Freq: Once | INTRAMUSCULAR | Status: AC
Start: 2022-11-09 — End: 2022-11-09
  Administered 2022-11-09: 15 mg via INTRAVENOUS
  Filled 2022-11-09: qty 1

## 2022-11-09 NOTE — Discharge Instructions (Signed)
IF YOU HAVE ANY WORSENING OR SEVERE PAIN, BLEEDING, CHEST PAIN, SHORTNESS OF BREATH, NUMBNESS, WEAKNESS, OR OTHER CONCERNING SYMPTOMS PLEASE SEEK MEDICAL ATTENTION    IF YOU DO NOT CONTINUE TO IMPROVE OR YOUR CONDITION WORSENS, PLEASE CONTACT YOUR DOCTOR OR RETURN IMMEDIATELY TO THE EMERGENCY DEPARTMENT.    YOU SHOULD SEE EITHER YOUR PRIMARY CARE DOCTOR OR CALL TO ARRANGE APPOINTMENT WITH ONE FOR RE-EVALUATION IN 24-48 HOURS.     OBTAINING A PRIMARY CARE APPOINTMENT    If you need a primary care doctor, please call the below number and ask who is receiving new patients.     [facility]  Telephone:  [phone]  [email or web address]    DOCTOR REFERRALS  Call [phone] (available 24 hours a day, 7 days a week) if you need any further referrals and we can help you find a primary care doctor or specialist.  Also, available online at:  [email or web address]    YOUR CONTACT INFORMATION  Before leaving please check with registration to make sure we have an up-to-date contact number.  You can call registration at [phone] to update your information.  For questions about your hospital bill, please call [phone].  For questions about your Emergency Dept Physician bill please call [phone].      FREE HEALTH SERVICES  If you need help with health or social services, please call 2-1-1 for a free referral to resources in your area.  2-1-1 is a free service connecting people with information on health insurance, free clinics, pregnancy, mental health, dental care, food assistance, housing, and substance abuse counseling.  Also, available online at:  [email or web address].org    ORTHOPEDIC INJURY   Please know that significant injuries can exist even when an initial x-ray is read as normal or negative.  This can occur because some fractures (broken bones) are not initially visible on x-rays.  For this reason, close outpatient follow-up with your primary care doctor or bone specialist  (orthopedist) is required.    MEDICATIONS AND FOLLOWUP  Please be aware that some prescription medications can cause drowsiness.  Use caution when driving or operating machinery.    The examination and treatment you have received in our Emergency Department is provided on an emergency basis, and is not intended to be a substitute for your primary care physician.  It is important that your doctor checks you again and that you report any new or remaining problems at that time.      SEDATING MEDICATIONS  Sedating medications include strong pain medications (e.g. narcotics), muscle relaxers, benzodiazepines (used for anxiety and as muscle relaxers), Benadryl/diphenhydramine and other antihistamines for allergic reactions/itching, and other medications.  If you are unsure if you have received a sedating medication, please ask your physician or nurse.  If you received a sedating medication: DO NOT drive a car. DO NOT operate machinery. DO NOT perform jobs where you need to be alert.  DO NOT drink alcoholic beverages while taking this medicine.     If you get dizzy, sit or lie down at the first signs. Be careful going up and down stairs.  Be extra careful to prevent falls.     Never give this medicine to others.     Keep this medicine out of reach of children.     Do not take or save old medicines. Throw them away when outdated.     Keep all medicines in a cool, dry place. DO NOT keep them in your bathroom medicine cabinet or in a cabinet   above the stove.    MEDICATION REFILLS  Please be aware that we cannot refill any prescriptions through the ER. If you need further treatment from what is provided at your ER visit, please follow up with your primary care doctor or your pain management specialist.

## 2022-11-09 NOTE — ED Provider Notes (Signed)
[facility] EMERGENCY DEPARTMENT  ATTENDING PHYSICIAN HISTORY AND PHYSICAL EXAM     Patient Name: Ricky Johnson, Ricky Johnson  Department:[facility]  Encounter Date:  11/09/2022  Attending Physician: Eldridge Dace, MD   Age: 57 y.o. male  Patient Room: Z08-W/Z08-W  PCP: Pcp, None, MD          History of Presenting Illness:     Nursing Triage note: pt to triage via wheelchair for c/o R-flank pain that has been going on for a week now. pt stated that he had a kidney stones on the past and his s/s is similar to that. pt stated that he had seen a few spots of blood when he uses the bathroom over the weekend. pt stated that he is unable to take a deep breath due to pain. pt denies dysuria/fever/chills/cp.  pmh DM, kidney stones. no pain meds taken prior to arrival. pt appears uncomfortable  Chief complaint: Flank Pain    Ricky Johnson is a 57 y.o. male PMHx multiple kidney stones, DM, high cholesterol, hypertension presenting to the ED for right-sided flank pain localized under his lower rib cage for the last 4 days, worse today.  Patient states that he has history of over a dozen kidney stones previously last requiring surgical intervention over 10 years ago.  His pain acutely worsened this morning when it felt like a "vice grip clamped down" over his right flank.  Had a episode of mild nausea this morning that resolved.  No fever, vomiting, or any other abdominal pain.          Review of Systems:  Physical Exam:     Review of Systems    Positive and negative ROS per above and in HPI. All other systems reviewed and negative.     Pulse 91  BP 107/67  Resp 18  SpO2 99 %  Temp 98.9 F (37.2 C)     Physical Exam  Vitals and nursing note reviewed.   Constitutional:       General: He is not in acute distress.  HENT:      Head: Normocephalic and atraumatic.      Mouth/Throat:      Mouth: Mucous membranes are moist.   Eyes:      Extraocular Movements: Extraocular movements intact.      Conjunctiva/sclera: Conjunctivae normal.       Pupils: Pupils are equal, round, and reactive to light.   Cardiovascular:      Rate and Rhythm: Normal rate and regular rhythm.      Heart sounds: Normal heart sounds.   Pulmonary:      Effort: Pulmonary effort is normal.      Breath sounds: Normal breath sounds.   Abdominal:      General: Abdomen is flat. There is no distension.      Palpations: Abdomen is soft.      Comments: Pain localized to right flank, no focal tenderness.   Musculoskeletal:         General: No swelling or deformity. Normal range of motion.      Cervical back: Normal range of motion and neck supple.   Skin:     General: Skin is warm.      Findings: No rash.   Neurological:      General: No focal deficit present.      Mental Status: He is alert and oriented to person, place, and time. Mental status is at baseline.   Psychiatric:  Mood and Affect: Mood normal.                 Interpretations, Clinical Decision Tools and Critical Care:     O2 Sat:  The patient's oxygen saturation was 99 % on room air. This was independently interpreted by me as Normal.         CT Abd/ Pelvis without Contrast    Result Date: 11/09/2022  1. There is a 4 mm distal right ureteral calculus that does not result in hydronephrosis. 2. There are multiple additional bilateral nonobstructive renal calculi. 3. Cholelithiasis is noted incidentally. Wynema Birch, MD 11/09/2022 2:06 PM         Procedures:   Procedures      Medical Decision Making:       MDM    Patient presents in the setting of right-sided flank pain, CT confirms distal right ureteral calculus without significant hydronephrosis.  UA shows no evidence of infection, after 2 dose of pain medicine here, with significant improvement of pain.  No signs of infection, pain controlled to such we will discharge with analgesia as well as Flomax.  Advised outpatient urology follow-up.  Patient comfortable at time of discharge, strict return precautions and follow up instructions provided.    ED Course as of  11/09/22 2045   Tue Nov 09, 2022   1605 Pain improved, feels comfortable with discharge  [AH]      ED Course User Index  [AH] Eldridge Dace, MD       Medical Decision Making                               Diagnosis/Disposition:        Final diagnoses:   Kidney stone       ED Disposition       ED Disposition   Discharge    Condition   --    Date/Time   Tue Nov 09, 2022  4:06 PM    Comment   Wilborn Deeter [facility] discharge to home/self care.    Condition at disposition: Stable                 Follow-Up Providers (if applicable)    [facility]  [address]  [place] [address]  [phone]  Schedule an appointment as soon as possible for a visit          Discharge Medication List as of 11/09/2022  4:15 PM        START taking these medications    Details   ibuprofen (ADVIL) 800 MG tablet Take 1 tablet (800 mg) by mouth every 8 (eight) hours as needed for Pain or Fever, Starting Tue 11/09/2022, Normal      ondansetron (ZOFRAN-ODT) 4 MG disintegrating tablet Take 1 tablet (4 mg) by mouth every 6 (six) hours as needed for Nausea, Starting Tue 11/09/2022, E-Rx      oxyCODONE-acetaminophen (PERCOCET) 5-325 MG per tablet Take 1 tablet by mouth every 4 (four) hours as needed for Pain, Starting Tue 11/09/2022, Until Tue 11/16/2022 at 2359, E-Rx      tamsulosin (FLOMAX) 0.4 MG Cap Take 1 capsule (0.4 mg) by mouth daily for 20 days Discontinue for lightheadedness, Starting Tue 11/09/2022, Until Mon 11/29/2022, E-Rx                    Attestations:  Jodene Nam, MD MPH    Scribe Attestation:    I am the first provider for this patient and I personally performed the services documented. Thornton Papas is scribing for me on this chart. This note and the patient instructions accurately reflect work and decisions made by me.      Documentation Notes:  Parts of this note were generated by the Epic EMR system/ Dragon speech recognition and may contain inherent errors or omissions not  intended by the user. Grammatical errors, random word insertions, deletions, pronoun errors and incomplete sentences are occasional consequences of this technology due to software limitations. Not all errors are caught or corrected.  My documentation is often completed after the patient is no longer under my clinical care. In some cases, the Epic EMR may pull updated results into the above documentation which may not reflect all results or information that were available to me at the time of my medical decision making.   If there are questions or concerns about the content of this note or information contained within the body of this dictation they should be addressed directly with the author for clarification.                  Eldridge Dace, MD  11/09/22 2045

## 2022-11-09 NOTE — ED Triage Notes (Signed)
Presents to ED c/o consistent non-radiating right flank pain starting on Thursday. Pt endorses multiple lithotripsy's in the past for kidney stones. Pt reports dark urine over the weekend but denies seeing blood in urine. Pt reports, chills and lightheadedness but denies vomiting, fevers or abdominal pain. A&Ox4, respirations even and unlabored.

## 2022-11-18 ENCOUNTER — Observation Stay
Admission: EM | Admit: 2022-11-18 | Discharge: 2022-11-19 | Disposition: A | Discharge status: Home / Self Care | Payer: No Typology Code available for payment source | Attending: Student in an Organized Health Care Education/Training Program | Admitting: Student in an Organized Health Care Education/Training Program

## 2022-11-18 ENCOUNTER — Emergency Department: Payer: No Typology Code available for payment source

## 2022-11-18 DIAGNOSIS — I10 Essential (primary) hypertension: Secondary | ICD-10-CM | POA: Insufficient documentation

## 2022-11-18 DIAGNOSIS — Z794 Long term (current) use of insulin: Secondary | ICD-10-CM | POA: Insufficient documentation

## 2022-11-18 DIAGNOSIS — E119 Type 2 diabetes mellitus without complications: Secondary | ICD-10-CM | POA: Insufficient documentation

## 2022-11-18 DIAGNOSIS — Z87442 Personal history of urinary calculi: Secondary | ICD-10-CM | POA: Insufficient documentation

## 2022-11-18 DIAGNOSIS — N201 Calculus of ureter: Secondary | ICD-10-CM | POA: Insufficient documentation

## 2022-11-18 DIAGNOSIS — N23 Unspecified renal colic: Secondary | ICD-10-CM | POA: Insufficient documentation

## 2022-11-18 DIAGNOSIS — E663 Overweight: Secondary | ICD-10-CM | POA: Insufficient documentation

## 2022-11-18 DIAGNOSIS — N179 Acute kidney failure, unspecified: Secondary | ICD-10-CM | POA: Insufficient documentation

## 2022-11-18 DIAGNOSIS — R600 Localized edema: Secondary | ICD-10-CM | POA: Insufficient documentation

## 2022-11-18 DIAGNOSIS — N200 Calculus of kidney: Principal | ICD-10-CM | POA: Diagnosis present

## 2022-11-18 DIAGNOSIS — E785 Hyperlipidemia, unspecified: Secondary | ICD-10-CM | POA: Insufficient documentation

## 2022-11-18 DIAGNOSIS — N132 Hydronephrosis with renal and ureteral calculous obstruction: Principal | ICD-10-CM | POA: Insufficient documentation

## 2022-11-18 DIAGNOSIS — Z6827 Body mass index (BMI) 27.0-27.9, adult: Secondary | ICD-10-CM | POA: Insufficient documentation

## 2022-11-18 LAB — HEPATIC FUNCTION PANEL (LFT)
ALT: 22 U/L (ref 0–55)
AST (SGOT): 23 U/L (ref 5–41)
Albumin/Globulin Ratio: 1.5 (ref 0.9–2.2)
Albumin: 4 g/dL (ref 3.5–5.0)
Alkaline Phosphatase: 101 U/L (ref 37–117)
Bilirubin Direct: 0.2 mg/dL (ref 0.0–0.5)
Bilirubin Indirect: 0.3 mg/dL (ref 0.2–1.0)
Bilirubin, Total: 0.5 mg/dL (ref 0.2–1.2)
Globulin: 2.6 g/dL (ref 2.0–3.6)
Protein, Total: 6.6 g/dL (ref 6.0–8.3)

## 2022-11-18 LAB — LAB USE ONLY - CBC WITH DIFFERENTIAL
Absolute Basophils: 0.04 10*3/uL (ref 0.00–0.08)
Absolute Eosinophils: 0.09 10*3/uL (ref 0.00–0.44)
Absolute Immature Granulocytes: 0.02 10*3/uL (ref 0.00–0.07)
Absolute Lymphocytes: 1.44 10*3/uL (ref 0.42–3.22)
Absolute Monocytes: 0.54 10*3/uL (ref 0.21–0.85)
Absolute Neutrophils: 4.97 10*3/uL (ref 1.10–6.33)
Absolute nRBC: 0 10*3/uL (ref <=0.00)
Basophils %: 0.6 %
Eosinophils %: 1.3 %
Hematocrit: 37.2 % — ABNORMAL LOW (ref 37.6–49.6)
Hemoglobin: 12.7 g/dL (ref 12.5–17.1)
Immature Granulocytes %: 0.3 %
Lymphocytes %: 20.3 %
MCH: 29.3 pg (ref 25.1–33.5)
MCHC: 34.1 g/dL (ref 31.5–35.8)
MCV: 85.9 fL (ref 78.0–96.0)
MPV: 10.6 fL (ref 8.9–12.5)
Monocytes %: 7.6 %
Neutrophils %: 69.9 %
Platelet Count: 295 10*3/uL (ref 142–346)
Preliminary Absolute Neutrophil Count: 4.97 10*3/uL (ref 1.10–6.33)
RBC: 4.33 10*6/uL (ref 4.20–5.90)
RDW: 13 % (ref 11–15)
WBC: 7.1 10*3/uL (ref 3.10–9.50)
nRBC %: 0 /100{WBCs} (ref <=0.0)

## 2022-11-18 LAB — URINALYSIS WITH REFLEX TO MICROSCOPIC EXAM - REFLEX TO CULTURE
Urine Bilirubin: NEGATIVE
Urine Blood: NEGATIVE
Urine Glucose: NEGATIVE
Urine Ketones: NEGATIVE mg/dL
Urine Leukocyte Esterase: NEGATIVE
Urine Nitrite: NEGATIVE
Urine Specific Gravity: 1.022 (ref 1.001–1.035)
Urine Urobilinogen: NORMAL mg/dL (ref 0.2–2.0)
Urine pH: 5.5 (ref 5.0–8.0)

## 2022-11-18 LAB — BASIC METABOLIC PANEL
Anion Gap: 8 (ref 5.0–15.0)
BUN: 21 mg/dL (ref 9–28)
CO2: 25 meq/L (ref 17–29)
Calcium: 9.6 mg/dL (ref 8.5–10.5)
Chloride: 104 meq/L (ref 99–111)
Creatinine: 1.3 mg/dL (ref 0.5–1.5)
GFR: >60 mL/min/{1.73_m2} (ref >=60.0)
Glucose: 147 mg/dL — ABNORMAL HIGH (ref 70–100)
Potassium: 4.8 meq/L (ref 3.5–5.3)
Sodium: 137 meq/L (ref 135–145)

## 2022-11-18 LAB — WHOLE BLOOD GLUCOSE POCT: Whole Blood Glucose POCT: 127 mg/dL — ABNORMAL HIGH (ref 70–100)

## 2022-11-18 LAB — LACTIC ACID: Whole Blood Lactic Acid: 1.1 mmol/L (ref 0.2–2.0)

## 2022-11-18 MED ORDER — ONDANSETRON HCL 4 MG/2ML IJ SOLN
4.0000 mg | Freq: Three times a day (TID) | INTRAMUSCULAR | Status: DC | PRN
Start: 2022-11-18 — End: 2022-11-19
  Administered 2022-11-18: 4 mg via INTRAVENOUS
  Filled 2022-11-18: qty 2

## 2022-11-18 MED ORDER — CARBOXYMETHYLCELLULOSE SOD PF 0.5 % OP SOLN
1.0000 [drp] | Freq: Three times a day (TID) | OPHTHALMIC | Status: DC | PRN
Start: 2022-11-18 — End: 2022-11-19

## 2022-11-18 MED ORDER — GLUCOSE 40 % PO GEL (WRAP)
15.0000 g | ORAL | Status: DC | PRN
Start: 2022-11-18 — End: 2022-11-19

## 2022-11-18 MED ORDER — POTASSIUM & SODIUM PHOSPHATES 280-160-250 MG PO PACK
2.0000 | PACK | ORAL | Status: DC | PRN
Start: 2022-11-18 — End: 2022-11-19

## 2022-11-18 MED ORDER — GLUCAGON 1 MG IJ SOLR (WRAP)
1.0000 mg | INTRAMUSCULAR | Status: DC | PRN
Start: 2022-11-18 — End: 2022-11-19

## 2022-11-18 MED ORDER — MORPHINE SULFATE 2 MG/ML IJ/IV SOLN (WRAP)
2.0000 mg | Status: DC | PRN
Start: 2022-11-18 — End: 2022-11-19

## 2022-11-18 MED ORDER — MAGNESIUM SULFATE IN D5W 1-5 GM/100ML-% IV SOLN
1.0000 g | INTRAVENOUS | Status: DC | PRN
Start: 2022-11-18 — End: 2022-11-19

## 2022-11-18 MED ORDER — ATORVASTATIN CALCIUM 20 MG PO TABS
10.0000 mg | ORAL_TABLET | Freq: Every evening | ORAL | Status: DC
Start: 2022-11-18 — End: 2022-11-19
  Administered 2022-11-18: 10 mg via ORAL
  Filled 2022-11-18: qty 1

## 2022-11-18 MED ORDER — ONDANSETRON HCL 4 MG PO TABS
4.0000 mg | ORAL_TABLET | Freq: Three times a day (TID) | ORAL | Status: DC | PRN
Start: 2022-11-18 — End: 2022-11-19

## 2022-11-18 MED ORDER — BENZOCAINE-MENTHOL MT LOZG (WRAP)
1.0000 | LOZENGE | OROMUCOSAL | Status: DC | PRN
Start: 2022-11-18 — End: 2022-11-19

## 2022-11-18 MED ORDER — GLUCOSE 40 % PO GEL (WRAP)
15.0000 g | ORAL | Status: DC | PRN
Start: 2022-11-18 — End: 2022-11-18

## 2022-11-18 MED ORDER — MORPHINE SULFATE 2 MG/ML IJ/IV SOLN (WRAP)
4.0000 mg | Freq: Once | Status: AC
Start: 2022-11-18 — End: 2022-11-18
  Administered 2022-11-18: 4 mg via INTRAVENOUS
  Filled 2022-11-18: qty 2

## 2022-11-18 MED ORDER — DEXTROSE 10 % IV BOLUS
12.5000 g | INTRAVENOUS | Status: DC | PRN
Start: 2022-11-18 — End: 2022-11-19

## 2022-11-18 MED ORDER — DEXTROSE 50 % IV SOLN
12.5000 g | INTRAVENOUS | Status: DC | PRN
Start: 2022-11-18 — End: 2022-11-19

## 2022-11-18 MED ORDER — ACETAMINOPHEN 325 MG PO TABS
650.0000 mg | ORAL_TABLET | Freq: Three times a day (TID) | ORAL | Status: DC | PRN
Start: 2022-11-18 — End: 2022-11-19

## 2022-11-18 MED ORDER — KETOROLAC TROMETHAMINE 30 MG/ML IJ SOLN
15.0000 mg | Freq: Once | INTRAMUSCULAR | Status: AC
Start: 2022-11-18 — End: 2022-11-18
  Administered 2022-11-18: 15 mg via INTRAVENOUS
  Filled 2022-11-18: qty 1

## 2022-11-18 MED ORDER — DEXTROSE 50 % IV SOLN
12.5000 g | INTRAVENOUS | Status: DC | PRN
Start: 2022-11-18 — End: 2022-11-18

## 2022-11-18 MED ORDER — INSULIN GLARGINE 100 UNIT/ML SC SOLN
7.0000 [IU] | Freq: Every evening | SUBCUTANEOUS | Status: DC
Start: 2022-11-18 — End: 2022-11-19
  Administered 2022-11-18: 7 [IU] via SUBCUTANEOUS
  Filled 2022-11-18: qty 7

## 2022-11-18 MED ORDER — INSULIN LISPRO 100 UNIT/ML SOLN (WRAP)
1.0000 [IU] | Freq: Three times a day (TID) | Status: DC
Start: 2022-11-18 — End: 2022-11-19

## 2022-11-18 MED ORDER — INSULIN LISPRO 100 UNIT/ML SOLN (WRAP)
1.0000 [IU] | Freq: Every evening | Status: DC
Start: 2022-11-18 — End: 2022-11-19

## 2022-11-18 MED ORDER — NALOXONE HCL 0.4 MG/ML IJ SOLN (WRAP)
0.2000 mg | INTRAMUSCULAR | Status: DC | PRN
Start: 2022-11-18 — End: 2022-11-19

## 2022-11-18 MED ORDER — KETOROLAC TROMETHAMINE 30 MG/ML IJ SOLN
15.0000 mg | Freq: Four times a day (QID) | INTRAMUSCULAR | Status: DC | PRN
Start: 2022-11-18 — End: 2022-11-19
  Administered 2022-11-18: 15 mg via INTRAVENOUS
  Filled 2022-11-18: qty 1

## 2022-11-18 MED ORDER — POTASSIUM CHLORIDE CRYS ER 20 MEQ PO TBCR
0.0000 meq | EXTENDED_RELEASE_TABLET | ORAL | Status: DC | PRN
Start: 2022-11-18 — End: 2022-11-19

## 2022-11-18 MED ORDER — HEPARIN SODIUM (PORCINE) 5000 UNIT/ML IJ SOLN
5000.0000 [IU] | Freq: Two times a day (BID) | INTRAMUSCULAR | Status: DC
Start: 2022-11-18 — End: 2022-11-19
  Administered 2022-11-18: 5000 [IU] via SUBCUTANEOUS
  Filled 2022-11-18: qty 1

## 2022-11-18 MED ORDER — TAMSULOSIN HCL 0.4 MG PO CAPS
0.4000 mg | ORAL_CAPSULE | Freq: Every day | ORAL | Status: DC
Start: 2022-11-18 — End: 2022-11-19
  Administered 2022-11-18 – 2022-11-19 (×2): 0.4 mg via ORAL
  Filled 2022-11-18 (×2): qty 1

## 2022-11-18 MED ORDER — BENZONATATE 100 MG PO CAPS
100.0000 mg | ORAL_CAPSULE | Freq: Three times a day (TID) | ORAL | Status: DC | PRN
Start: 2022-11-18 — End: 2022-11-19

## 2022-11-18 MED ORDER — INSULIN GLARGINE 100 UNIT/ML SC SOLN
17.0000 [IU] | Freq: Every evening | SUBCUTANEOUS | Status: DC
Start: 2022-11-18 — End: 2022-11-19

## 2022-11-18 MED ORDER — MELATONIN 3 MG PO TABS
3.0000 mg | ORAL_TABLET | Freq: Every evening | ORAL | Status: DC | PRN
Start: 2022-11-18 — End: 2022-11-19

## 2022-11-18 MED ORDER — POTASSIUM CHLORIDE 20 MEQ PO PACK
0.0000 meq | PACK | ORAL | Status: DC | PRN
Start: 2022-11-18 — End: 2022-11-19

## 2022-11-18 MED ORDER — SALINE SPRAY 0.65 % NA SOLN
2.0000 | NASAL | Status: DC | PRN
Start: 2022-11-18 — End: 2022-11-19

## 2022-11-18 MED ORDER — DEXTROSE 10 % IV BOLUS
12.5000 g | INTRAVENOUS | Status: DC | PRN
Start: 2022-11-18 — End: 2022-11-18

## 2022-11-18 MED ORDER — GLUCAGON 1 MG IJ SOLR (WRAP)
1.0000 mg | INTRAMUSCULAR | Status: DC | PRN
Start: 2022-11-18 — End: 2022-11-18

## 2022-11-18 MED ORDER — POTASSIUM CHLORIDE 10 MEQ/100ML IV SOLN (WRAP)
10.0000 meq | INTRAVENOUS | Status: DC | PRN
Start: 2022-11-18 — End: 2022-11-19

## 2022-11-18 NOTE — Progress Notes (Addendum)
 1756: Upon admission to expedited obs, patient endorsed mild pain @ L flank. Patient received pain medications prior to admission from ED. Dr. Effie Berkshire notified this RN that patient is reg diet until midnight then NPO for lithotripsy vs. Stent pending urine

## 2022-11-18 NOTE — ED Triage Notes (Signed)
 Patient presents to ED c/o kidney stones. States he came to ED last week and was diagnosed with a 4mm stone. States he passed a stone on Wednesday and has it in a pill bottle with him. C/o R flank pain x1 week and L flank pain starting this am. Denies bloo

## 2022-11-18 NOTE — H&P (Addendum)
.      ADMISSION HISTORY AND PHYSICAL EXAM    Date Time: 11/18/22 6:36 PM  Patient Name: Ricky Johnson  Attending Physician: Dorinda Hill, DO  Primary Care Physician: Pcp, None, MD    CC: L flank pain    Assessment    Maxamillian Tienda is a 57 y.o.

## 2022-11-18 NOTE — ED Notes (Signed)
 Bed: 8 B  Expected date:   Expected time:   Means of arrival:   Comments:  U09 here

## 2022-11-18 NOTE — Progress Notes (Addendum)
 NURSING ADMISSION NOTE     Date Time: 11/18/22  Patient Name: Ricky Johnson  Attending Physician: Dorinda Hill, DO     Date of Admission:   11/18/22     Reason for Admission:   Kidney stone     Admitted from:   ED     Nursing Note:   Patient Aox4. Engl

## 2022-11-18 NOTE — ED to IP RN Note (Signed)
 [facility]PITAL EMERGENCY DEPT  ED NURSING NOTE FOR THE RECEIVING INPATIENT NURSE   ED NURSE Vivia Budge   [facility] [id number]   ED CHARGE RN Zoe   ADMISSION INFORMATION   Ricky Johnson is a 57 y.o. male admitted with an ED diagnosis of:    1.

## 2022-11-18 NOTE — Plan of Care (Signed)
 Problem: Pain interferes with ability to perform ADL  Goal: Pain at adequate level as identified by patient  Outcome: Progressing  Flowsheets (Taken 11/18/2022 1820)  Pain at adequate level as identified by patient:   Identify patient comfort function go

## 2022-11-18 NOTE — ED Provider Notes (Signed)
 [place] EMERGENCY DEPARTMENT  ATTENDING PHYSICIAN HISTORY AND PHYSICAL EXAM       Medical Decision Making:     Pt with recent ED visit for flank pain, diagnosed with kidney stone  Still having ongoing pain, not controlled with med sat home  Feels he may ha

## 2022-11-18 NOTE — Consults (Signed)
 CONSULTATION  [facility] [phone]  If unreachable/after hours call [phone]    Date Time: 11/18/22 5:13 PM  Patient Name: Ricky Johnson,Ricky Johnson  Requesting Physician: Dorinda Hill, DO      Reason for Consultation:   Flank pain  Ureteral stone    Ass

## 2022-11-19 DIAGNOSIS — N201 Calculus of ureter: Secondary | ICD-10-CM

## 2022-11-19 LAB — BASIC METABOLIC PANEL
Anion Gap: 7 (ref 5.0–15.0)
BUN: 28 mg/dL (ref 9–28)
CO2: 25 meq/L (ref 17–29)
Calcium: 8.5 mg/dL (ref 8.5–10.5)
Chloride: 106 meq/L (ref 99–111)
Creatinine: 1.2 mg/dL (ref 0.5–1.5)
GFR: >60 mL/min/{1.73_m2} (ref >=60.0)
Glucose: 108 mg/dL — ABNORMAL HIGH (ref 70–100)
Potassium: 4.2 meq/L (ref 3.5–5.3)
Sodium: 138 meq/L (ref 135–145)

## 2022-11-19 LAB — LAB USE ONLY - URINE GRAY CULTURE HOLD TUBE

## 2022-11-19 LAB — MAGNESIUM: Magnesium: 1.8 mg/dL (ref 1.6–2.6)

## 2022-11-19 LAB — WHOLE BLOOD GLUCOSE POCT: Whole Blood Glucose POCT: 101 mg/dL — ABNORMAL HIGH (ref 70–100)

## 2022-11-19 LAB — REFERRAL TO DISPATCH HEALTH - ED TO HOME: DISPATCHHEALTH: 20241101085303

## 2022-11-19 SURGERY — CYSTOSCOPY, INSERTION INDWELLING URETERAL STENT
Anesthesia: Choice | Site: Pelvis | Laterality: Left

## 2022-11-19 MED ORDER — TAMSULOSIN HCL 0.4 MG PO CAPS
0.4000 mg | ORAL_CAPSULE | Freq: Every day | ORAL | 0 refills | Status: AC
Start: 2022-11-19 — End: ?

## 2022-11-19 MED ORDER — OXYCODONE HCL 5 MG PO TABS
5.0000 mg | ORAL_TABLET | Freq: Once | ORAL | Status: AC
Start: 2022-11-19 — End: 2022-11-19
  Administered 2022-11-19: 5 mg via ORAL
  Filled 2022-11-19: qty 1

## 2022-11-19 MED ORDER — OXYCODONE HCL 5 MG PO TABS
ORAL_TABLET | ORAL | 0 refills | Status: DC
Start: 2022-11-19 — End: 2023-03-09

## 2022-11-19 MED ORDER — NALOXONE HCL 4 MG/0.1ML NA LIQD
NASAL | 0 refills | Status: AC
Start: 2022-11-19 — End: ?

## 2022-11-19 NOTE — Discharge Summary (Signed)
 MEDICINE DISCHARGE SUMMARY  CONDITIONAL DISCHARGE: No         Date Time: 11/19/22 4:46 PM  Patient Name: Ricky Johnson  Attending Physician: Margarita Rana MD  Primary Care Physician: Marisa Sprinkles, MD    Date of Admission: 11/18/2022  Date of Disc

## 2022-11-19 NOTE — Progress Notes (Signed)
 PROGRESS NOTE  [facility] [phone]  If unreachable/after hours call [phone]    Date Time: 11/19/22 9:49 AM  Patient Name: Ricky Johnson,Ricky Johnson      Assessment:   [name] is a 57 y.o. male with history of bilateral kidney stone  who present

## 2022-11-19 NOTE — Progress Notes (Signed)
 I have seen and examined the patient separately from the APP.  I have discussed this patient with APP, Roe Rutherford and together we have formulated the Assessment & Plan that is clearly dictated on this note by the APP.  I have edited when necessary.  I

## 2022-11-19 NOTE — Progress Notes (Signed)
11/19/22 0853   CMA Tasks   CMA tasks [facility] referral     ED CMS placed referral for [facility].      Rubbie Battiest., CMS  Case Management Specialist  Emergency Department  [facility] Case Management  [phone]

## 2022-11-19 NOTE — Discharge Instr - AVS First Page (Signed)
 Reason for your Hospital Admission:  Flank Pain  Known Kidney Stones  8 mm Left Ureteral Stone    Instructions for after your discharge:  -- Schedule a follow up visit with your primary care provider - within 1 week of discharge from the hospital.  -- If y

## 2022-11-19 NOTE — Discharge Summary -  Nursing (Signed)
 Patient ordered discharged to home. Patient Alert and oriented x4. VSS WNL. RA. Peripheral IV removed. Patient educated and instructed on discharge follow up, medications and instructions post discharge. Patient verbalized an understanding. Patient transpo

## 2022-11-19 NOTE — Discharge Instructions (Addendum)
 Reason for your Hospital Admission:  Flank Pain  Known Kidney Stones  8 mm Left Ureteral Stone    Instructions for after your discharge:  -- Schedule a follow up visit with your primary care provider - within 1 week of discharge from the hospital.  -- If y

## 2022-11-19 NOTE — UM Notes (Signed)
 PATIENT NAME: Ricky Johnson,Ricky Johnson   DOB: [birth year]       58 y.o. male with history of hypertension, hyperlipidemia, type 2 diabetes, nephrolithiasis recently presenting to the emergency department for passed kidney stone presenting again with left-sided fla

## 2022-11-19 NOTE — Progress Notes (Signed)
11/19/22 1610   Case Management Quick Doc   Acknowledgment of Outpatient/Observation Observation letter given

## 2023-03-09 ENCOUNTER — Emergency Department
Admission: EM | Admit: 2023-03-09 | Discharge: 2023-03-09 | Disposition: A | Discharge status: Home / Self Care | Payer: BC Managed Care – PPO | Attending: Emergency Medicine | Admitting: Emergency Medicine

## 2023-03-09 ENCOUNTER — Emergency Department: Payer: BC Managed Care – PPO

## 2023-03-09 DIAGNOSIS — R112 Nausea with vomiting, unspecified: Secondary | ICD-10-CM | POA: Insufficient documentation

## 2023-03-09 DIAGNOSIS — R109 Unspecified abdominal pain: Secondary | ICD-10-CM | POA: Insufficient documentation

## 2023-03-09 LAB — URINALYSIS WITH REFLEX TO MICROSCOPIC EXAM - REFLEX TO CULTURE
Urine Bilirubin: NEGATIVE
Urine Blood: NEGATIVE
Urine Leukocyte Esterase: NEGATIVE
Urine Nitrite: NEGATIVE
Urine Specific Gravity: 1.037 — ABNORMAL HIGH (ref 1.001–1.035)
Urine Urobilinogen: 2 mg/dL (ref 0.2–2.0)
Urine pH: 6 (ref 5.0–8.0)

## 2023-03-09 LAB — LAB USE ONLY - CBC WITH DIFFERENTIAL
Absolute Basophils: 0.01 10*3/uL (ref 0.00–0.08)
Absolute Eosinophils: 0.02 10*3/uL (ref 0.00–0.44)
Absolute Immature Granulocytes: 0.01 10*3/uL (ref 0.00–0.07)
Absolute Lymphocytes: 0.82 10*3/uL (ref 0.42–3.22)
Absolute Monocytes: 0.59 10*3/uL (ref 0.21–0.85)
Absolute Neutrophils: 2 10*3/uL (ref 1.10–6.33)
Absolute nRBC: 0 10*3/uL (ref <=0.00)
Basophils %: 0.3 %
Eosinophils %: 0.6 %
Hematocrit: 37.2 % — ABNORMAL LOW (ref 37.6–49.6)
Hemoglobin: 13.1 g/dL (ref 12.5–17.1)
Immature Granulocytes %: 0.3 %
Lymphocytes %: 23.8 %
MCH: 29.6 pg (ref 25.1–33.5)
MCHC: 35.2 g/dL (ref 31.5–35.8)
MCV: 84.2 fL (ref 78.0–96.0)
MPV: 10.2 fL (ref 8.9–12.5)
Monocytes %: 17.1 %
Neutrophils %: 57.9 %
Platelet Count: 215 10*3/uL (ref 142–346)
Preliminary Absolute Neutrophil Count: 2 10*3/uL (ref 1.10–6.33)
RBC: 4.42 10*6/uL (ref 4.20–5.90)
RDW: 12 % (ref 11–15)
WBC: 3.45 10*3/uL (ref 3.10–9.50)
nRBC %: 0 /100{WBCs} (ref <=0.0)

## 2023-03-09 LAB — COMPREHENSIVE METABOLIC PANEL
ALT: 29 U/L (ref <=55)
AST (SGOT): 29 U/L (ref <=41)
Albumin/Globulin Ratio: 1.4 (ref 0.9–2.2)
Albumin: 3.6 g/dL (ref 3.5–5.0)
Alkaline Phosphatase: 95 U/L (ref 37–117)
Anion Gap: 6 (ref 5.0–15.0)
BUN: 21 mg/dL (ref 9–28)
Bilirubin, Total: 0.5 mg/dL (ref 0.2–1.2)
CO2: 27 meq/L (ref 17–29)
Calcium: 9 mg/dL (ref 8.5–10.5)
Chloride: 105 meq/L (ref 99–111)
Creatinine: 1.1 mg/dL (ref 0.5–1.5)
GFR: >60 mL/min/{1.73_m2} (ref >=60.0)
Globulin: 2.5 g/dL (ref 2.0–3.6)
Glucose: 135 mg/dL — ABNORMAL HIGH (ref 70–100)
Potassium: 4 meq/L (ref 3.5–5.3)
Protein, Total: 6.1 g/dL (ref 6.0–8.3)
Sodium: 138 meq/L (ref 135–145)

## 2023-03-09 LAB — LIPASE: Lipase: 19 U/L (ref 8–78)

## 2023-03-09 LAB — LAB USE ONLY - URINE GRAY CULTURE HOLD TUBE

## 2023-03-09 LAB — MAGNESIUM: Magnesium: 1.7 mg/dL (ref 1.6–2.6)

## 2023-03-09 MED ORDER — ONDANSETRON HCL 4 MG/2ML IJ SOLN
4.0000 mg | Freq: Once | INTRAMUSCULAR | Status: AC
Start: 2023-03-09 — End: 2023-03-09
  Administered 2023-03-09: 4 mg via INTRAVENOUS
  Filled 2023-03-09: qty 2

## 2023-03-09 MED ORDER — SODIUM CHLORIDE 0.9 % IV BOLUS
1000.0000 mL | Freq: Once | INTRAVENOUS | Status: AC
Start: 2023-03-09 — End: 2023-03-09
  Administered 2023-03-09: 1000 mL via INTRAVENOUS

## 2023-03-09 MED ORDER — KETOROLAC TROMETHAMINE 30 MG/ML IJ SOLN
15.0000 mg | Freq: Once | INTRAMUSCULAR | Status: AC
Start: 2023-03-09 — End: 2023-03-09
  Administered 2023-03-09: 15 mg via INTRAVENOUS
  Filled 2023-03-09: qty 1

## 2023-03-09 MED ORDER — IOHEXOL 350 MG/ML IV SOLN
100.0000 mL | Freq: Once | INTRAVENOUS | Status: AC | PRN
Start: 2023-03-09 — End: 2023-03-09
  Administered 2023-03-09: 100 mL via INTRAVENOUS

## 2023-03-09 MED ORDER — MORPHINE SULFATE 2 MG/ML IJ/IV SOLN (WRAP)
4.0000 mg | Freq: Once | Status: AC
Start: 2023-03-09 — End: 2023-03-09
  Administered 2023-03-09: 4 mg via INTRAVENOUS
  Filled 2023-03-09: qty 2

## 2023-03-09 MED ORDER — ONDANSETRON 4 MG PO TBDP
4.0000 mg | ORAL_TABLET | Freq: Four times a day (QID) | ORAL | 0 refills | Status: AC | PRN
Start: 2023-03-09 — End: ?

## 2023-03-09 MED ORDER — OXYCODONE HCL 5 MG PO TABS
ORAL_TABLET | ORAL | 0 refills | Status: AC
Start: 2023-03-09 — End: ?

## 2023-03-09 MED ORDER — ACETAMINOPHEN 325 MG PO TABS
650.0000 mg | ORAL_TABLET | Freq: Once | ORAL | Status: AC
Start: 2023-03-09 — End: 2023-03-09
  Administered 2023-03-09: 650 mg via ORAL
  Filled 2023-03-09: qty 2

## 2023-03-09 NOTE — ED Triage Notes (Signed)
 58 year old male presents to ED for bilateral flank pain and dysuria x5 days. Endorsing nausea/emesis. Hx kidney stones with multiple surgical interventions. PT reports passing multiple stones from each side 11/2022 but states there are more. Denies blood in urine. Aox4, breathing unlabored, speaking clearly.

## 2023-03-09 NOTE — ED Provider Notes (Signed)
 [facility] EMERGENCY DEPARTMENT  ATTENDING PHYSICIAN HISTORY AND PHYSICAL EXAM     Patient Name: Ricky Johnson, Ricky Johnson  Department:[facility]  Encounter Date:  03/09/2023  Attending Physician: Ricky Johnson., MD   Age: 58 y.o. male  Patient Room: S 06/S 06  PCP: Ricky Smoke, MD           Diagnosis/Disposition:     Final diagnoses:   Flank pain       ED Disposition       ED Disposition   Discharge    Condition   --    Date/Time   Wed Mar 09, 2023  5:52 PM    Comment   Ricky Johnson [facility] discharge to home/self care.    Condition at disposition: Stable                 Follow-Up Providers (if applicable)    your primary    In 1 week         Discharge Medication List as of 03/09/2023  5:53 PM              Medical Decision Making:              Medical Decision Making  Based on the patient's history evaluation unclear as to the etiology of his symptoms today.  Does have a history of renal colic, and as symptoms feel like prior renal colic this may be the issue.  However, distribution of the pain seemed atypical.  Was given pain medication in the emergency department with improvement.  Multiple studies were obtained to look for evidence of this, UTI, pancreatitis, hepatitis, renal failure, anemia, etc.  At this point we are awaiting results of urinalysis.  Anticipate discharge once those results return.    Amount and/or Complexity of Data Reviewed  Labs: ordered.  Radiology: ordered.    Risk  OTC drugs.  Prescription drug management.            History of Presenting Illness:     Nursing Triage note:    Chief complaint: Flank Pain    Ricky Johnson is a 58 y.o. male who presents to the emergency department today with a 5-day history of nausea, vomiting, and back pain similar to his renal colic back pain.  States he was out of town when this started.  3 days ago nausea was improved and he was able to tolerate liquids but unable to keep food down.  Denies fever.  Otherwise, patient without complaint.          Review of  Systems:  Physical Exam:         Positive and negative ROS per above and in HPI.     Triage Vitals: Pulse (!) 55  BP 95/67  Resp 16  SpO2 96 %  Temp 97.5 F (36.4 C)     Constitutional: Overweight male, laying on a gurney, in no acute distress   Head: Normocephalic, atraumatic  Eyes: EOMI.  Sclera not pale, injected.  ENT: Mucous membranes moist.  Nose symmetric.  Neck: Normal range of motion. Non-tender.  Respiratory/Chest: Clear to auscultation. No respiratory distress.   Cardiovascular: RRR w/o murmur/gallop  Abdomen: Soft and non-tender. No guarding. No masses or hepatosplenomegaly.  Musculoskeletal: No edema. No cyanosis.  Neurological: CN II-XII intact grossly. No focal motor deficits by observation. Speech normal.  Skin: Warm and dry. No rash.  Psychiatric: Normal affect. Normal concentration.  Interpretations, Clinical Decision Tools and Critical Care:           Radiology: All available radiologist's interpretations were reviewed if not separately interpreted by me.                           Procedures:   Procedures      Attestations:     Scribe Attestation: There was no scribe involved in the care of this patient.     Documentation Notes:  Parts of this note were generated by the Epic EMR system/ Dragon speech recognition and may contain inherent errors or omissions not intended by the user. Grammatical errors, random word insertions, deletions, pronoun errors and incomplete sentences are occasional consequences of this technology due to software limitations. Not all errors are caught or corrected.  My documentation is often completed after the patient is no longer under my clinical care. In some cases, the Epic EMR may pull updated results into the above documentation which may not reflect all results or information that were available to me at the time of my medical decision making.   If there are questions or concerns about the content of this note or information contained within the body  of this dictation they should be addressed directly with the author for clarification.                  Ricky Johnson, Ricky Johnson., MD  03/10/23 [phone]

## 2023-03-09 NOTE — ED Notes (Signed)
 Pt discharged to home. IV removed. Pt A+Ox4, vitals stable, offering no complaints at this time. Pt verbalizes understanding of discharge instructions regarding follow-up care and prescriptions. Pt left unit with steady gait and all personal belongings.

## 2023-03-09 NOTE — ED Provider Notes (Signed)
 [facility] EMERGENCY DEPARTMENT  SUBSEQUENT CARE NOTE     Patient Name: Ricky Johnson, Ricky Johnson  Department:[facility] EM[facility]  Encounter Date:  03/09/2023  Attending Physician:  [place] Lynwood CHRISTELLA Mickey., MD  Room:  S 06/S 06  Patient DOB:  [birth year]  Age: 58 y.o. male  MRN:  [id number]  PCP: Freddrick Smoke, MD         Diagnosis/Disposition:     Final Impression  Final diagnoses:   Flank pain     Disposition  ED Disposition       ED Disposition   Discharge    Condition   --    Date/Time   Wed Mar 09, 2023  5:52 PM    Comment   [name] discharge to home/self care.    Condition at disposition: Stable               Follow up  your primary    In 1 week      Prescriptions  Discharge Medication List as of 03/09/2023  5:53 PM              Medical Decision Making:      Nursing Triage Note: Pt to ED via wheelchair for bilateral flank pain beginning 3 days ago. Pt reports nausea, vomiting, and dysuria. Pt reports subjective fever, afebrile in triage. Pt reports history of kidney stones, states symptoms are similar.  Chief complaint: Flank Pain      Care Summary:  The care of Ricky Johnson was transferred to me by Cogbill, Lynwood CHRISTELLA Mickey., MD at approximately 3:12 PM.  They are a 58 y.o. male who presents to the emergency department today with a 5-day history of nausea, vomiting, and back pain similar to his renal colic back pain. .     Patient Awaiting:  UA    Clinical Course:       UA was nonconcerning for acute infectious pathology.  Patient with proteinuria, glucosuria, ketones.  CT abdomen pelvis showing bilateral nonobstructing renal calculi.  Patient was provided Rx for oxycodone , Zofran .  Patient referred to urologist and PCP for further follow-up.                 I utilized my training and experience to weigh the risk of discharge against the risks of further testing, imaging, or hospitalization. At this time, I estimate the risks of additional testing, imaging, or hospitalization to be equal to or greater than the risk of  discharge. I discussed my risk assessment with the patient, and the patient consents to the risk of discharge as well as the risk of uncertainty in estimating outcomes. Given the negative workup, lack of concerning findings, and overall clinical stability, the patient does not meet criteria for hospitalization or a higher level of care. History, physical exam, and laboratory findings were discussed with the patient. Shared decision-making was conducted, and the patient is comfortable with outpatient management and understands the need for follow-up. Prior to discharge symptoms controlled, patient well appearing. I have instructed the patient to return to the ER at any time if there are any new or worsening symptoms.  The patient expressed understanding of and agreement with this plan.  Opportunity was given for questions prior to discharge and all stated questions were answered to the patient's satisfaction.  Home care instructions provided. The patient understands the discharge plan and agrees to follow up with their primary care provider as needed.  ATTESTATIONS:     Scribe Attestation:    I was acting as a neurosurgeon for Cogbill, Lynwood CHRISTELLA Raddle., MD on Ricky Johnson,Ricky Johnson  Treatment Team: Scribe: Taheripour, Dolly     Documentation Notes:    Parts of this note were generated by the Epic EMR system/ Dragon speech recognition and may contain inherent errors or omissions not intended by the user. Grammatical errors, random word insertions, deletions, pronoun errors and incomplete sentences are occasional consequences of this technology due to software limitations. Not all errors are caught or corrected.    My documentation is often completed after the patient is no longer under my clinical care. In some cases, the Epic EMR may pull updated results into the above documentation which may not reflect all results or information that was available to me at the time of my medical decision making.     If there are  questions or concerns about the content of this note or information contained within the body of this dictation they should be addressed directly with the author for clarification.           Tanda Tinnie DASEN, MD  03/10/23 0100

## 2023-03-09 NOTE — Discharge Instructions (Addendum)
 Dear Ricky Johnson:    Thank you for choosing the [facility] Emergency Department, the premier emergency department in the [place] area.  I hope your visit today was EXCELLENT. You will receive a survey via text message that will give you the opportunity to provide feedback to your team about your visit. Please do not hesitate to reach out with any questions!    Specific instructions for your visit today:    ============    Use ibuprofen  for pain, up to 400mg  four times a day.  This would be two of the over the counter 200 mg ibuprofen  tablets 4 times a day.  You can also take tylenol  in addition to this, 650mg -1gm four times a day.  This would be two OTC regular strength, or extra strength, tablets.  The medications work very well together, so even if tylenol  does not help much for pain normally it can make a big difference when compared to just ibuprofen .     ============     IF YOU DO NOT CONTINUE TO IMPROVE OR YOUR CONDITION WORSENS, PLEASE CONTACT YOUR DOCTOR OR RETURN IMMEDIATELY TO THE EMERGENCY DEPARTMENT.    Sincerely,  Ricky Johnson, Ricky Johnson., MD  Attending Emergency Physician  [facility] Emergency Department      OBTAINING A PRIMARY CARE APPOINTMENT    Primary care physicians (PCPs, also known as primary care doctors) are either internists or family medicine doctors. Both types of PCPs focus on health promotion, disease prevention, patient education and counseling, and treatment of acute and chronic medical conditions.    If you need a primary care doctor, please call the below number and ask who is receiving new patients.     [facility]  Telephone:  [phone]  [email or web address]    DOCTOR REFERRALS  Call [phone] (available 24 hours a day, 7 days a week) if you need any further referrals and we can help you find a primary care doctor or specialist.  Referral services are also available online at:  [email or web address] Please reach out to our ED Care  Management Specialist, Kacie Oneal, if you encounter any difficulty in scheduling your [facility] follow up appointment.  Kacie Oneal can be reached at [phone] or [email or web address].    YOUR CONTACT INFORMATION  Before leaving please check with registration to make sure we have an up-to-date contact number.  You can call registration at [phone] to update your information.  For questions about your hospital bill, please call [phone].  For questions about your Emergency Dept Physician bill please call [phone].      FREE HEALTH SERVICES  If you need help with health or social services, please call 2-1-1 for a free referral to resources in your area.  2-1-1 is a free service connecting people with information on health insurance, free clinics, pregnancy, mental health, dental care, food assistance, housing, and substance abuse counseling.  Also, available online at:  http://www.[facility].org    ORTHOPEDIC INJURY   Please know that significant injuries can exist even when an initial x-ray is read as normal or negative.  This can occur because some fractures (broken bones) are not initially visible on x-rays.  For this reason, close outpatient follow-up with your primary care doctor or bone specialist (orthopedist) is required.    MEDICATIONS AND FOLLOWUP  Please be aware that some prescription medications can cause drowsiness.  Use caution when driving or operating machinery.    The examination and treatment you have received in our Emergency Department  is provided on an emergency basis, and is not intended to be a substitute for your primary care physician.  It is important that your doctor checks you again and that you report any new or remaining problems at that time.      ASSISTANCE WITH INSURANCE    Affordable Care Act  [facility])  Call to start or finish an application, compare plans, enroll or ask a question.  [phone]  TTY: [phone]  Web:  Healthcare.gov    Help Enrolling in  [facility]  [place]   ([phone] [phone] (TOLL-FREE)  [phone] (TTY)  Web:  Http://www.coverva.org    Local Help Enrolling in the [employer]  [place]  [facility]  8068732888 (MAIN)  Email:  health-help@nvfs .org  Web:  Blackjackmyths.is  Address:  [address] [place], [place] [address]    SEDATING MEDICATIONS  Sedating medications include strong pain medications (e.g. narcotics), muscle relaxers, benzodiazepines (used for anxiety and as muscle relaxers), Benadryl/diphenhydramine and other antihistamines for allergic reactions/itching, and other medications.  If you are unsure if you have received a sedating medication, please ask your physician or nurse.  If you received a sedating medication: DO NOT drive a car. DO NOT operate machinery. DO NOT perform jobs where you need to be alert.  DO NOT drink alcoholic beverages while taking this medicine.     If you get dizzy, sit or lie down at the first signs. Be careful going up and down stairs.  Be extra careful to prevent falls.     Never give this medicine to others.     Keep this medicine out of reach of children.     Do not take or save old medicines. Throw them away when outdated.     Keep all medicines in a cool, dry place. DO NOT keep them in your bathroom medicine cabinet or in a cabinet above the stove.    MEDICATION REFILLS  Please be aware that we cannot refill any prescriptions through the ER. If you need further treatment from what is provided at your ER visit, please follow up with your primary care doctor or your pain management specialist.    FREESTANDING EMERGENCY DEPARTMENTS OF [facility]  Did you know [place] has two freestanding ERs located just a few miles away?  [facility] and [facility] [place] have short wait times, easy free parking directly in front of the building and top patient satisfaction scores - and the same Board Certified Emergency Medicine doctors as [facility].
